# Patient Record
Sex: Female | Born: 2000 | Race: Black or African American | Hispanic: No | Marital: Single | Smoking: Never smoker
Health system: Southern US, Community
[De-identification: ages and names within clinical notes are randomized; demographics above are authoritative.]

## PROBLEM LIST (undated history)

## (undated) DIAGNOSIS — J309 Allergic rhinitis, unspecified: Secondary | ICD-10-CM

## (undated) DIAGNOSIS — E611 Iron deficiency: Secondary | ICD-10-CM

## (undated) DIAGNOSIS — G4700 Insomnia, unspecified: Secondary | ICD-10-CM

## (undated) DIAGNOSIS — J4520 Mild intermittent asthma, uncomplicated: Secondary | ICD-10-CM

## (undated) DIAGNOSIS — G43909 Migraine, unspecified, not intractable, without status migrainosus: Secondary | ICD-10-CM

## (undated) DIAGNOSIS — N946 Dysmenorrhea, unspecified: Secondary | ICD-10-CM

## (undated) HISTORY — DX: Migraine, unspecified, not intractable, without status migrainosus: G43.909

## (undated) HISTORY — DX: Iron deficiency: E61.1

## (undated) HISTORY — DX: Insomnia, unspecified: G47.00

## (undated) HISTORY — DX: Dysmenorrhea, unspecified: N94.6

## (undated) HISTORY — DX: Mild intermittent asthma, uncomplicated: J45.20

## (undated) HISTORY — DX: Allergic rhinitis, unspecified: J30.9

## (undated) HISTORY — PX: NO PAST SURGERIES: SHX2092

---
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
## 2019-04-26 ENCOUNTER — Other Ambulatory Visit: Payer: Self-pay

## 2019-04-26 DIAGNOSIS — Z20822 Contact with and (suspected) exposure to covid-19: Secondary | ICD-10-CM

## 2019-04-28 LAB — NOVEL CORONAVIRUS, NAA: SARS-CoV-2, NAA: NOT DETECTED

## 2019-08-16 ENCOUNTER — Encounter: Payer: Self-pay | Admitting: Pediatrics

## 2019-08-16 ENCOUNTER — Other Ambulatory Visit: Payer: Self-pay

## 2019-08-16 ENCOUNTER — Ambulatory Visit (INDEPENDENT_AMBULATORY_CARE_PROVIDER_SITE_OTHER): Payer: Medicaid Other | Admitting: Pediatrics

## 2019-08-16 VITALS — BP 120/79 | HR 68 | Ht 63.19 in | Wt 160.0 lb

## 2019-08-16 DIAGNOSIS — Z713 Dietary counseling and surveillance: Secondary | ICD-10-CM

## 2019-08-16 DIAGNOSIS — Z1389 Encounter for screening for other disorder: Secondary | ICD-10-CM

## 2019-08-16 DIAGNOSIS — Z113 Encounter for screening for infections with a predominantly sexual mode of transmission: Secondary | ICD-10-CM | POA: Diagnosis not present

## 2019-08-16 DIAGNOSIS — Z00121 Encounter for routine child health examination with abnormal findings: Secondary | ICD-10-CM

## 2019-08-16 NOTE — Patient Instructions (Addendum)
Well Child Nutrition, Teen This sheet provides general nutrition recommendations. Talk with a health care provider or a diet and nutrition specialist (dietitian) if you have any questions. Nutrition The amount of food you need to eat every day depends on your age, sex, size, and activity level. To figure out your daily calorie needs, look for a calorie calculator online or talk with your health care provider. Balanced diet Eat a balanced diet. Try to include:  Fruits. Aim for 1-2 cups a day. Examples of 1 cup of fruit include 1 large banana, 1 small apple, 8 large strawberries, or 1 large orange. Try to eat fresh or frozen fruits, and avoid fruits that have added sugars.  Vegetables. Aim for 2-3 cups a day. Examples of 1 cup of vegetables include 2 medium carrots, 1 large tomato, or 2 stalks of celery. Try to eat vegetables with a variety of colors.  Low-fat dairy. Aim for 3 cups a day. Examples of 1 cup of dairy include 8 oz (230 mL) of milk, 8 oz (230 g) of yogurt, or 1 oz (44 g) of natural cheese. Getting enough calcium and vitamin D is important for growth and healthy bones. Include fat-free or low-fat milk, cheese, and yogurt in your diet. If you are unable to tolerate dairy (lactose intolerant) or you choose not to consume dairy, you may include fortified soy beverages (soy milk).  Whole grains. Of the grain foods that you eat each day (such as pasta, rice, and tortillas), aim to include 6-8 "ounce-equivalents" of whole-grain options. Examples of 1 ounce-equivalent of whole grains include 1 cup of whole-wheat cereal,  cup of brown rice, or 1 slice of whole-wheat bread.  Lean proteins. Aim for 5-6 "ounce-equivalents" a day. Eat a variety of protein foods, including lean meats, seafood, poultry, eggs, legumes (beans and peas), nuts, seeds, and soy products. ? A cut of meat or fish that is the size of a deck of cards is about 3-4 ounce-equivalents. ? Foods that provide 1 ounce-equivalent of  protein include 1 egg,  cup of nuts or seeds, or 1 tablespoon (16 g) of peanut butter.  Tips for healthy snacking  A snack should not be the size of a full meal. Eat snacks that have 200 calories or less. Examples include: ?  whole-wheat pita with  cup hummus. ? 2 or 3 slices of deli [place] wrapped around one cheese stick. ?  apple with 1 tablespoon of peanut butter. ? 10 baked chips with salsa.  Keep cut-up fruits and vegetables available at home and at school so they are easy to eat.  Pack healthy snacks the night before or when you pack your lunch.  Avoid pre-packaged foods. These tend to be higher in fat, sugar, and salt (sodium).  Get involved with shopping, or ask the main food shopper in your family to get healthy snacks that you like.  Avoid chips, candy, cake, and soft drinks. Foods to avoid  [place] or heavily processed foods, such as hot dogs and microwaveable dinners.  Drinks that contain a lot of sugar, such as sports drinks, sodas, and juice.  Foods that contain a lot of fat, salt (sodium), or sugar. General instructions  Make time for regular exercise. Try to be active for 60 minutes every day.  Drink plenty of water, especially while you are playing sports or exercising.  Do not skip meals, especially breakfast.  Avoid overeating. Eat when you are hungry, and stop eating when you are full.  Do not  hesitate to try new foods.  Help with meal prep and learn how to prepare meals.  Avoid fad diets. These may affect your mood and growth.  If you are worried about your body image, talk with your parents, your health care provider, or another trusted adult like a coach or counselor. You may be at risk for developing an eating disorder. Eating disorders can lead to serious medical problems.  Food allergies may cause you to have a reaction (such as a rash, diarrhea, or vomiting) after eating or drinking. Talk with your health care provider if you have concerns  about food allergies. Summary  Eat a balanced diet. Include whole grains, fruits, vegetables, proteins, and low-fat dairy.  Choose healthy snacks that are 200 calories or less.  Drink plenty of water.  Be active for 60 minutes or more every day. This information is not intended to replace advice given to you by your health care provider. Make sure you discuss any questions you have with your health care provider. Document Released: 04/09/2017 Document Revised: 12/15/2018 Document Reviewed: 04/09/2017 Elsevier Patient Education  2020 [employer].  Teenage Dating Abuse In a dating relationship, teenagers may experience some form of abuse. Abuse can be physical, emotional, psychological, or sexual. It can also include harassment via texting, e-mail, or social media. Dating abuse happens in both gay and straight relationships. Either a female or a female can be the victim of dating abuse. Dating abuse is never the victim's fault. No one deserves to be a victim of dating abuse. Nothing you wear, do, or say gives another person the right to hurt you. Remember, you have every right to say "no." If you are in a dating relationship that feels uncomfortable or frightening in any way, trust your feelings and get out of it. It could become, or may already be, abusive. Many teenagers do not report abuse because they are afraid to tell family and friends. They may also be afraid that the abuser will retaliate if he or she finds out that the abuse was reported. However, it is important to get help right away if:  You are being abused.  A teenager you know shows signs of abuse or tells you that he or she is being abused.  You see anything that does not seem right. What are the different kinds of dating abuse? Verbal abuse  Threats.  Bullying.  Insults or false accusations.  Demeaning or belittling comments. Emotional and psychological abuse  Attempting to control a partner's activities.   Trying to destroy the person's self-confidence and self-esteem.  Isolating the person from other friends and family.  Public embarrassment.  Threats of violence.  Checking cell phones, e-mails, or social media without permission. Physical abuse  Punching, pushing, shoving, hitting, or slapping.  Kicking.  Throwing things at someone.  Biting.  Arm twisting or bending back fingers.  Hair pulling.  Slamming someone against a wall.  Using a weapon to harm the person. Sexual abuse  Sexual abuse is forced or unwanted sexual activity or rape.  It includes coercing or pressuring someone to engage in sexual activity, or trying to engage in sexual activity with someone who is under the influence of drugs or alcohol.  Date rape is when sexual intercourse occurs within a relationship but without consent.  Teenage girls in heterosexual relationships are the most likely to suffer from sexual abuse. Stalking Stalking usually occurs when one partner in a dating relationship wants to end the relationship and the other  partner does not. If the partner who wants to continue makes the other partner feel frightened, threatened, or harassed, it may be stalking. Examples of stalking behavior include:  Following.  Spying.  Confronting.  Appearing at the partner's home, school, or place of work.  Sending unwanted gifts.  Repeatedly contacting the person by phone, mail, or e-mail.  Using Internet sites to stalk. What are the risk factors for dating abuse? Any teenager can be a victim of dating abuse. However, certain risk factors can make it more likely that a teenager may be abused. These include:  Being female.  Living in a low-income household.  Being sexually active.  Being involved in delinquent activities, including violent behavior and drug use.  Being from a risky family background or social environment. What are the warning signs of dating abuse? It is a warning sign of  dating abuse if your partner:  Pressures you to have sex.  Becomes extremely jealous or possessive and thinks that these displays of emotion are signs of love.  Tries to control you and forcefully make all decisions.  Tries to keep you from spending time with close friends or family.  Verbally and emotionally abuses you by: ? Yelling at you. ? Spreading rumors about you. ? Manipulating you. ? Swearing at you. ? Trying to make you feel guilty. ? Checking your cell phone or social media without your permission.  Drinks too much or uses drugs and then blames the abusive behavior on the alcohol and drugs.  Threatens physical violence.  Has abused a previous partner.  Accepts or defends the use of violence by others. What are common behaviors of people affected by abuse? You may be a victim of abuse if you:  Make excuses for your partner or apologize for poor behavior.  Stop seeing friends or family, or do not show up to places as promised.  Lose interest in activities that you used to enjoy.  Have to let your partner know where you are and who you are with.  Are constantly monitored by a jealous partner with calls and texts. What can I do if I think I am being abused? If you suspect abuse, or if you or someone you know has experienced abuse:  Text "loveis" to (564) 615-3195 to talk with a peer advocate.  Go online to www.thehotline.org, or call the [place] [facility] [employer] 531 093 5374).  Call your local emergency services (911 in the [place] How can teenage dating abuse be prevented? The worst thing to do is keep your fears to yourself. Talk to someone you trust who can help.  Ask for help from a parent, guardian, teacher, or health care provider if you are worried about dating violence. Take some basic precautions to protect yourself: ? Tell your friends and family that you are worried about your relationship. ? When you go out, make sure that  someone knows where you are going and what time you plan to return. ? Go out with a group of friends. ? Plan a safe way to get home. What are the treatment or care options? Treatment programs for dating abuse and violence include:  Protective services, such as: ? Social services. ? Safety planning. ? Restraining orders. ? Legally mandated treatment for the abuser.  Mental health services, such as: ? Individual, family, or group counseling. ? Support groups.  Medical services to treat any injuries or STIs (sexually transmitted infections). Where can I get more information?  National Dating Abuse Helpline: ?  Phone: 215-738-01861-272-714-2318. ? Online: www.loveisrespect.org ? Text: "loveis" to 9811922522.  National Domestic Violence Hotline: ? Phone: 1-800-799-SAFE ((340) 857-37201-929 657 9153). ? Online: www.thehotline.org  National Sexual Assault Hotline: 1-800-656-HOPE 518-236-7861(1-613-293-6702).  National Sexual Assault Online Hotline: ohl.rainn.org  Centers for Disease Control and Prevention (CDC): FootballExhibition.com.brwww.cdc.gov Summary  Every young person has a right to safe and healthy relationships. Learn the signs of abuse and, if you believe you are the victim of dating abuse, seek help. This information is not intended to replace advice given to you by your health care provider. Make sure you discuss any questions you have with your health care provider. Document Released: 06/23/2007 Document Revised: 10/08/2017 Document Reviewed: 10/08/2017 Elsevier Patient Education  2020 [employer]
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
## 2019-08-17 LAB — CHLAMYDIA/GC NAA, CONFIRMATION
Chlamydia trachomatis, NAA: NEGATIVE
Neisseria gonorrhoeae, NAA: NEGATIVE

## 2019-08-30 ENCOUNTER — Ambulatory Visit: Payer: Medicaid Other | Attending: Pediatrics

## 2019-12-06 ENCOUNTER — Telehealth: Payer: Self-pay | Admitting: Adult Health

## 2019-12-06 NOTE — Telephone Encounter (Signed)
Tried to reach the patient to remind her of her appointment/restrictions, no answer, phone just rang and rang.

## 2019-12-07 ENCOUNTER — Encounter: Payer: Medicaid Other | Admitting: Adult Health

## 2019-12-30 DIAGNOSIS — Z3491 Encounter for supervision of normal pregnancy, unspecified, first trimester: Secondary | ICD-10-CM | POA: Diagnosis not present

## 2019-12-30 DIAGNOSIS — N912 Amenorrhea, unspecified: Secondary | ICD-10-CM | POA: Diagnosis not present

## 2019-12-30 DIAGNOSIS — Z3201 Encounter for pregnancy test, result positive: Secondary | ICD-10-CM | POA: Diagnosis not present

## 2019-12-30 DIAGNOSIS — Z3A08 8 weeks gestation of pregnancy: Secondary | ICD-10-CM | POA: Diagnosis not present

## 2019-12-30 DIAGNOSIS — Z3682 Encounter for antenatal screening for nuchal translucency: Secondary | ICD-10-CM | POA: Diagnosis not present

## 2020-01-24 DIAGNOSIS — Z3A12 12 weeks gestation of pregnancy: Secondary | ICD-10-CM | POA: Diagnosis not present

## 2020-01-24 DIAGNOSIS — Z3689 Encounter for other specified antenatal screening: Secondary | ICD-10-CM | POA: Diagnosis not present

## 2020-01-24 DIAGNOSIS — Z3401 Encounter for supervision of normal first pregnancy, first trimester: Secondary | ICD-10-CM | POA: Diagnosis not present

## 2020-01-24 DIAGNOSIS — Z3682 Encounter for antenatal screening for nuchal translucency: Secondary | ICD-10-CM | POA: Diagnosis not present

## 2020-02-24 DIAGNOSIS — Z3689 Encounter for other specified antenatal screening: Secondary | ICD-10-CM | POA: Diagnosis not present

## 2020-03-01 DIAGNOSIS — Z3A18 18 weeks gestation of pregnancy: Secondary | ICD-10-CM | POA: Diagnosis not present

## 2020-03-01 DIAGNOSIS — O219 Vomiting of pregnancy, unspecified: Secondary | ICD-10-CM | POA: Diagnosis not present

## 2020-03-01 DIAGNOSIS — O2312 Infections of bladder in pregnancy, second trimester: Secondary | ICD-10-CM | POA: Diagnosis not present

## 2020-03-01 DIAGNOSIS — R112 Nausea with vomiting, unspecified: Secondary | ICD-10-CM | POA: Diagnosis not present

## 2020-03-16 DIAGNOSIS — Z3A20 20 weeks gestation of pregnancy: Secondary | ICD-10-CM | POA: Diagnosis not present

## 2020-03-16 DIAGNOSIS — O321XX0 Maternal care for breech presentation, not applicable or unspecified: Secondary | ICD-10-CM | POA: Diagnosis not present

## 2020-03-16 DIAGNOSIS — Z363 Encounter for antenatal screening for malformations: Secondary | ICD-10-CM | POA: Diagnosis not present

## 2020-05-11 DIAGNOSIS — Z3689 Encounter for other specified antenatal screening: Secondary | ICD-10-CM | POA: Diagnosis not present

## 2020-05-11 DIAGNOSIS — Z23 Encounter for immunization: Secondary | ICD-10-CM | POA: Diagnosis not present

## 2020-05-11 DIAGNOSIS — R8289 Other abnormal findings on cytological and histological examination of urine: Secondary | ICD-10-CM | POA: Diagnosis not present

## 2020-05-29 DIAGNOSIS — R8290 Unspecified abnormal findings in urine: Secondary | ICD-10-CM | POA: Diagnosis not present

## 2020-05-29 DIAGNOSIS — Z23 Encounter for immunization: Secondary | ICD-10-CM | POA: Diagnosis not present

## 2020-06-12 DIAGNOSIS — R8289 Other abnormal findings on cytological and histological examination of urine: Secondary | ICD-10-CM | POA: Diagnosis not present

## 2020-06-15 DIAGNOSIS — Z3A33 33 weeks gestation of pregnancy: Secondary | ICD-10-CM | POA: Diagnosis not present

## 2020-06-15 DIAGNOSIS — O99891 Other specified diseases and conditions complicating pregnancy: Secondary | ICD-10-CM | POA: Diagnosis not present

## 2020-06-15 DIAGNOSIS — R1030 Lower abdominal pain, unspecified: Secondary | ICD-10-CM | POA: Diagnosis not present

## 2020-07-10 DIAGNOSIS — Z3A00 Weeks of gestation of pregnancy not specified: Secondary | ICD-10-CM | POA: Diagnosis not present

## 2020-07-10 DIAGNOSIS — Z3A37 37 weeks gestation of pregnancy: Secondary | ICD-10-CM | POA: Diagnosis not present

## 2020-07-10 DIAGNOSIS — R519 Headache, unspecified: Secondary | ICD-10-CM | POA: Diagnosis not present

## 2020-07-10 DIAGNOSIS — R030 Elevated blood-pressure reading, without diagnosis of hypertension: Secondary | ICD-10-CM | POA: Diagnosis not present

## 2020-07-10 DIAGNOSIS — Z3A36 36 weeks gestation of pregnancy: Secondary | ICD-10-CM | POA: Diagnosis not present

## 2020-07-10 DIAGNOSIS — Z3689 Encounter for other specified antenatal screening: Secondary | ICD-10-CM | POA: Diagnosis not present

## 2020-07-10 DIAGNOSIS — O99891 Other specified diseases and conditions complicating pregnancy: Secondary | ICD-10-CM | POA: Diagnosis not present

## 2020-07-10 DIAGNOSIS — R600 Localized edema: Secondary | ICD-10-CM | POA: Diagnosis not present

## 2020-07-10 DIAGNOSIS — O163 Unspecified maternal hypertension, third trimester: Secondary | ICD-10-CM | POA: Insufficient documentation

## 2020-07-11 DIAGNOSIS — Z3A00 Weeks of gestation of pregnancy not specified: Secondary | ICD-10-CM | POA: Diagnosis not present

## 2020-07-11 DIAGNOSIS — O163 Unspecified maternal hypertension, third trimester: Secondary | ICD-10-CM | POA: Diagnosis not present

## 2020-07-12 DIAGNOSIS — Z20822 Contact with and (suspected) exposure to covid-19: Secondary | ICD-10-CM | POA: Diagnosis not present

## 2020-07-12 DIAGNOSIS — O9902 Anemia complicating childbirth: Secondary | ICD-10-CM | POA: Diagnosis not present

## 2020-07-12 DIAGNOSIS — Z3A37 37 weeks gestation of pregnancy: Secondary | ICD-10-CM | POA: Diagnosis not present

## 2020-07-12 DIAGNOSIS — O165 Unspecified maternal hypertension, complicating the puerperium: Secondary | ICD-10-CM | POA: Diagnosis not present

## 2020-07-12 DIAGNOSIS — O134 Gestational [pregnancy-induced] hypertension without significant proteinuria, complicating childbirth: Secondary | ICD-10-CM | POA: Diagnosis not present

## 2020-07-12 DIAGNOSIS — O133 Gestational [pregnancy-induced] hypertension without significant proteinuria, third trimester: Secondary | ICD-10-CM | POA: Diagnosis not present

## 2020-07-12 DIAGNOSIS — O8612 Endometritis following delivery: Secondary | ICD-10-CM | POA: Diagnosis not present

## 2020-07-13 DIAGNOSIS — Z3A37 37 weeks gestation of pregnancy: Secondary | ICD-10-CM | POA: Diagnosis not present

## 2020-07-13 DIAGNOSIS — O133 Gestational [pregnancy-induced] hypertension without significant proteinuria, third trimester: Secondary | ICD-10-CM | POA: Diagnosis not present

## 2020-07-13 DIAGNOSIS — O165 Unspecified maternal hypertension, complicating the puerperium: Secondary | ICD-10-CM | POA: Diagnosis not present

## 2021-11-30 ENCOUNTER — Other Ambulatory Visit: Payer: Self-pay

## 2021-11-30 ENCOUNTER — Emergency Department (HOSPITAL_COMMUNITY): Payer: Medicaid Other

## 2021-11-30 ENCOUNTER — Encounter (HOSPITAL_COMMUNITY): Payer: Self-pay

## 2021-11-30 ENCOUNTER — Emergency Department (HOSPITAL_COMMUNITY)
Admission: EM | Admit: 2021-11-30 | Discharge: 2021-11-30 | Disposition: A | Discharge status: Home / Self Care | Payer: Medicaid Other | Attending: Emergency Medicine | Admitting: Emergency Medicine

## 2021-11-30 DIAGNOSIS — S5001XA Contusion of right elbow, initial encounter: Secondary | ICD-10-CM | POA: Diagnosis not present

## 2021-11-30 DIAGNOSIS — W232XXA Caught, crushed, jammed or pinched between a moving and stationary object, initial encounter: Secondary | ICD-10-CM | POA: Diagnosis not present

## 2021-11-30 DIAGNOSIS — M25521 Pain in right elbow: Secondary | ICD-10-CM | POA: Diagnosis not present
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
## 2021-11-30 NOTE — Discharge Instructions (Signed)
Your x-ray was negative.  Follow-up with your primary if her symptoms continue.  Take Tylenol Motrin for pain, no fracture was noted. ?

## 2022-01-27 DIAGNOSIS — R102 Pelvic and perineal pain: Secondary | ICD-10-CM | POA: Diagnosis not present

## 2022-01-27 DIAGNOSIS — N920 Excessive and frequent menstruation with regular cycle: Secondary | ICD-10-CM | POA: Diagnosis not present

## 2022-01-27 DIAGNOSIS — R109 Unspecified abdominal pain: Secondary | ICD-10-CM | POA: Diagnosis not present

## 2022-02-20 DIAGNOSIS — Z6832 Body mass index (BMI) 32.0-32.9, adult: Secondary | ICD-10-CM | POA: Diagnosis not present

## 2022-02-20 DIAGNOSIS — L03111 Cellulitis of right axilla: Secondary | ICD-10-CM | POA: Diagnosis not present

## 2022-02-20 DIAGNOSIS — L732 Hidradenitis suppurativa: Secondary | ICD-10-CM | POA: Diagnosis not present

## 2022-02-20 DIAGNOSIS — L03112 Cellulitis of left axilla: Secondary | ICD-10-CM | POA: Diagnosis not present

## 2022-02-26 DIAGNOSIS — L02412 Cutaneous abscess of left axilla: Secondary | ICD-10-CM | POA: Diagnosis not present

## 2022-02-26 DIAGNOSIS — Z6831 Body mass index (BMI) 31.0-31.9, adult: Secondary | ICD-10-CM | POA: Diagnosis not present

## 2022-03-01 DIAGNOSIS — Z6831 Body mass index (BMI) 31.0-31.9, adult: Secondary | ICD-10-CM | POA: Diagnosis not present

## 2022-03-01 DIAGNOSIS — Z5189 Encounter for other specified aftercare: Secondary | ICD-10-CM | POA: Diagnosis not present

## 2022-03-05 DIAGNOSIS — Z6831 Body mass index (BMI) 31.0-31.9, adult: Secondary | ICD-10-CM | POA: Diagnosis not present

## 2022-03-05 DIAGNOSIS — L732 Hidradenitis suppurativa: Secondary | ICD-10-CM | POA: Diagnosis not present

## 2022-03-29 DIAGNOSIS — O26899 Other specified pregnancy related conditions, unspecified trimester: Secondary | ICD-10-CM | POA: Diagnosis not present

## 2022-03-29 DIAGNOSIS — Z6829 Body mass index (BMI) 29.0-29.9, adult: Secondary | ICD-10-CM | POA: Diagnosis not present

## 2022-03-29 DIAGNOSIS — R109 Unspecified abdominal pain: Secondary | ICD-10-CM | POA: Diagnosis not present

## 2022-03-29 DIAGNOSIS — R059 Cough, unspecified: Secondary | ICD-10-CM | POA: Diagnosis not present

## 2022-03-30 DIAGNOSIS — O99511 Diseases of the respiratory system complicating pregnancy, first trimester: Secondary | ICD-10-CM | POA: Diagnosis not present

## 2022-03-30 DIAGNOSIS — J40 Bronchitis, not specified as acute or chronic: Secondary | ICD-10-CM | POA: Diagnosis not present

## 2022-03-30 DIAGNOSIS — R059 Cough, unspecified: Secondary | ICD-10-CM | POA: Diagnosis not present

## 2022-03-30 DIAGNOSIS — Z20822 Contact with and (suspected) exposure to covid-19: Secondary | ICD-10-CM | POA: Diagnosis not present

## 2022-04-11 DIAGNOSIS — N912 Amenorrhea, unspecified: Secondary | ICD-10-CM | POA: Diagnosis not present

## 2022-04-11 DIAGNOSIS — Z3201 Encounter for pregnancy test, result positive: Secondary | ICD-10-CM | POA: Diagnosis not present

## 2022-04-11 DIAGNOSIS — Z3490 Encounter for supervision of normal pregnancy, unspecified, unspecified trimester: Secondary | ICD-10-CM | POA: Diagnosis not present

## 2022-04-11 DIAGNOSIS — Z3689 Encounter for other specified antenatal screening: Secondary | ICD-10-CM | POA: Diagnosis not present

## 2022-04-11 DIAGNOSIS — Z3481 Encounter for supervision of other normal pregnancy, first trimester: Secondary | ICD-10-CM | POA: Diagnosis not present

## 2022-04-11 DIAGNOSIS — Z36 Encounter for antenatal screening for chromosomal anomalies: Secondary | ICD-10-CM | POA: Diagnosis not present

## 2022-05-23 DIAGNOSIS — L732 Hidradenitis suppurativa: Secondary | ICD-10-CM | POA: Diagnosis not present

## 2022-05-23 DIAGNOSIS — Z79899 Other long term (current) drug therapy: Secondary | ICD-10-CM | POA: Diagnosis not present

## 2022-06-06 DIAGNOSIS — R7989 Other specified abnormal findings of blood chemistry: Secondary | ICD-10-CM | POA: Diagnosis not present

## 2022-06-06 DIAGNOSIS — Z3492 Encounter for supervision of normal pregnancy, unspecified, second trimester: Secondary | ICD-10-CM | POA: Diagnosis not present

## 2022-06-06 DIAGNOSIS — Z3689 Encounter for other specified antenatal screening: Secondary | ICD-10-CM | POA: Diagnosis not present

## 2022-07-04 DIAGNOSIS — Z23 Encounter for immunization: Secondary | ICD-10-CM | POA: Diagnosis not present

## 2022-07-04 DIAGNOSIS — Z362 Encounter for other antenatal screening follow-up: Secondary | ICD-10-CM | POA: Diagnosis not present

## 2022-08-05 DIAGNOSIS — O365990 Maternal care for other known or suspected poor fetal growth, unspecified trimester, not applicable or unspecified: Secondary | ICD-10-CM | POA: Diagnosis not present

## 2022-08-15 DIAGNOSIS — Z3689 Encounter for other specified antenatal screening: Secondary | ICD-10-CM | POA: Diagnosis not present

## 2022-08-15 DIAGNOSIS — Z23 Encounter for immunization: Secondary | ICD-10-CM | POA: Diagnosis not present

## 2022-08-15 DIAGNOSIS — O365990 Maternal care for other known or suspected poor fetal growth, unspecified trimester, not applicable or unspecified: Secondary | ICD-10-CM | POA: Diagnosis not present

## 2022-08-16 DIAGNOSIS — O99013 Anemia complicating pregnancy, third trimester: Secondary | ICD-10-CM | POA: Insufficient documentation

## 2022-08-20 DIAGNOSIS — L732 Hidradenitis suppurativa: Secondary | ICD-10-CM | POA: Diagnosis not present

## 2022-08-20 DIAGNOSIS — L02411 Cutaneous abscess of right axilla: Secondary | ICD-10-CM | POA: Diagnosis not present

## 2022-08-26 DIAGNOSIS — Z363 Encounter for antenatal screening for malformations: Secondary | ICD-10-CM | POA: Diagnosis not present

## 2022-08-26 DIAGNOSIS — Z8279 Family history of other congenital malformations, deformations and chromosomal abnormalities: Secondary | ICD-10-CM | POA: Diagnosis not present

## 2022-08-26 DIAGNOSIS — O365990 Maternal care for other known or suspected poor fetal growth, unspecified trimester, not applicable or unspecified: Secondary | ICD-10-CM | POA: Diagnosis not present

## 2022-08-26 DIAGNOSIS — O09893 Supervision of other high risk pregnancies, third trimester: Secondary | ICD-10-CM | POA: Diagnosis not present

## 2022-08-26 DIAGNOSIS — O99213 Obesity complicating pregnancy, third trimester: Secondary | ICD-10-CM | POA: Diagnosis not present

## 2022-08-26 DIAGNOSIS — Z3A30 30 weeks gestation of pregnancy: Secondary | ICD-10-CM | POA: Diagnosis not present

## 2022-08-26 DIAGNOSIS — Z3689 Encounter for other specified antenatal screening: Secondary | ICD-10-CM | POA: Diagnosis not present

## 2022-08-26 DIAGNOSIS — O365930 Maternal care for other known or suspected poor fetal growth, third trimester, not applicable or unspecified: Secondary | ICD-10-CM | POA: Diagnosis not present

## 2022-08-30 DIAGNOSIS — O365990 Maternal care for other known or suspected poor fetal growth, unspecified trimester, not applicable or unspecified: Secondary | ICD-10-CM | POA: Diagnosis not present

## 2022-09-05 DIAGNOSIS — J00 Acute nasopharyngitis [common cold]: Secondary | ICD-10-CM | POA: Diagnosis not present

## 2022-09-05 DIAGNOSIS — Z20822 Contact with and (suspected) exposure to covid-19: Secondary | ICD-10-CM | POA: Diagnosis not present

## 2022-09-12 DIAGNOSIS — Z3A32 32 weeks gestation of pregnancy: Secondary | ICD-10-CM | POA: Diagnosis not present

## 2022-09-12 DIAGNOSIS — O365930 Maternal care for other known or suspected poor fetal growth, third trimester, not applicable or unspecified: Secondary | ICD-10-CM | POA: Diagnosis not present

## 2022-09-16 DIAGNOSIS — O09893 Supervision of other high risk pregnancies, third trimester: Secondary | ICD-10-CM | POA: Diagnosis not present

## 2022-09-16 DIAGNOSIS — Z3689 Encounter for other specified antenatal screening: Secondary | ICD-10-CM | POA: Diagnosis not present

## 2022-09-16 DIAGNOSIS — O365930 Maternal care for other known or suspected poor fetal growth, third trimester, not applicable or unspecified: Secondary | ICD-10-CM | POA: Diagnosis not present

## 2022-09-16 DIAGNOSIS — O99213 Obesity complicating pregnancy, third trimester: Secondary | ICD-10-CM | POA: Diagnosis not present

## 2022-09-16 DIAGNOSIS — Z3A33 33 weeks gestation of pregnancy: Secondary | ICD-10-CM | POA: Diagnosis not present

## 2022-09-16 DIAGNOSIS — Z369 Encounter for antenatal screening, unspecified: Secondary | ICD-10-CM | POA: Diagnosis not present

## 2022-09-16 DIAGNOSIS — Z362 Encounter for other antenatal screening follow-up: Secondary | ICD-10-CM | POA: Diagnosis not present

## 2022-09-30 DIAGNOSIS — O365990 Maternal care for other known or suspected poor fetal growth, unspecified trimester, not applicable or unspecified: Secondary | ICD-10-CM | POA: Diagnosis not present

## 2022-09-30 DIAGNOSIS — O365930 Maternal care for other known or suspected poor fetal growth, third trimester, not applicable or unspecified: Secondary | ICD-10-CM | POA: Diagnosis not present

## 2022-10-07 DIAGNOSIS — O09893 Supervision of other high risk pregnancies, third trimester: Secondary | ICD-10-CM | POA: Diagnosis not present

## 2022-10-07 DIAGNOSIS — Z362 Encounter for other antenatal screening follow-up: Secondary | ICD-10-CM | POA: Diagnosis not present

## 2022-10-07 DIAGNOSIS — O365930 Maternal care for other known or suspected poor fetal growth, third trimester, not applicable or unspecified: Secondary | ICD-10-CM | POA: Diagnosis not present

## 2022-10-07 DIAGNOSIS — Z3A36 36 weeks gestation of pregnancy: Secondary | ICD-10-CM | POA: Diagnosis not present

## 2022-10-07 DIAGNOSIS — O99213 Obesity complicating pregnancy, third trimester: Secondary | ICD-10-CM | POA: Diagnosis not present

## 2022-10-07 DIAGNOSIS — Z3689 Encounter for other specified antenatal screening: Secondary | ICD-10-CM | POA: Diagnosis not present

## 2022-10-10 DIAGNOSIS — Z3689 Encounter for other specified antenatal screening: Secondary | ICD-10-CM | POA: Diagnosis not present

## 2022-10-10 DIAGNOSIS — O365990 Maternal care for other known or suspected poor fetal growth, unspecified trimester, not applicable or unspecified: Secondary | ICD-10-CM | POA: Diagnosis not present

## 2022-10-13 DIAGNOSIS — D62 Acute posthemorrhagic anemia: Secondary | ICD-10-CM | POA: Diagnosis not present

## 2022-10-13 DIAGNOSIS — O365930 Maternal care for other known or suspected poor fetal growth, third trimester, not applicable or unspecified: Secondary | ICD-10-CM | POA: Diagnosis not present

## 2022-10-13 DIAGNOSIS — Z3A37 37 weeks gestation of pregnancy: Secondary | ICD-10-CM | POA: Diagnosis not present

## 2022-10-13 DIAGNOSIS — L732 Hidradenitis suppurativa: Secondary | ICD-10-CM | POA: Diagnosis not present

## 2022-10-13 DIAGNOSIS — O9902 Anemia complicating childbirth: Secondary | ICD-10-CM | POA: Diagnosis not present

## 2022-10-13 DIAGNOSIS — O9972 Diseases of the skin and subcutaneous tissue complicating childbirth: Secondary | ICD-10-CM | POA: Diagnosis not present

## 2022-10-14 DIAGNOSIS — Z3A37 37 weeks gestation of pregnancy: Secondary | ICD-10-CM | POA: Diagnosis not present

## 2022-10-14 DIAGNOSIS — L732 Hidradenitis suppurativa: Secondary | ICD-10-CM | POA: Diagnosis not present

## 2022-10-14 DIAGNOSIS — O3473 Maternal care for abnormality of vulva and perineum, third trimester: Secondary | ICD-10-CM | POA: Diagnosis not present

## 2022-10-14 DIAGNOSIS — O365990 Maternal care for other known or suspected poor fetal growth, unspecified trimester, not applicable or unspecified: Secondary | ICD-10-CM | POA: Diagnosis not present

## 2022-10-14 DIAGNOSIS — Z3A00 Weeks of gestation of pregnancy not specified: Secondary | ICD-10-CM | POA: Diagnosis not present

## 2022-10-14 DIAGNOSIS — O9952 Diseases of the respiratory system complicating childbirth: Secondary | ICD-10-CM | POA: Diagnosis not present

## 2023-01-07 DIAGNOSIS — L732 Hidradenitis suppurativa: Secondary | ICD-10-CM | POA: Diagnosis not present
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
## 2023-01-26 DIAGNOSIS — R079 Chest pain, unspecified: Secondary | ICD-10-CM | POA: Diagnosis not present

## 2023-01-26 DIAGNOSIS — I10 Essential (primary) hypertension: Secondary | ICD-10-CM | POA: Diagnosis not present

## 2023-01-26 DIAGNOSIS — R0789 Other chest pain: Secondary | ICD-10-CM | POA: Diagnosis not present

## 2023-01-26 DIAGNOSIS — J45909 Unspecified asthma, uncomplicated: Secondary | ICD-10-CM | POA: Diagnosis not present

## 2023-01-26 DIAGNOSIS — N926 Irregular menstruation, unspecified: Secondary | ICD-10-CM | POA: Diagnosis not present

## 2023-01-26 DIAGNOSIS — L732 Hidradenitis suppurativa: Secondary | ICD-10-CM | POA: Diagnosis not present

## 2023-01-27 DIAGNOSIS — R079 Chest pain, unspecified: Secondary | ICD-10-CM | POA: Diagnosis not present

## 2023-02-12 ENCOUNTER — Ambulatory Visit: Payer: Medicaid Other | Admitting: Family Medicine

## 2023-02-20 ENCOUNTER — Ambulatory Visit: Payer: Medicaid Other | Admitting: Nurse Practitioner

## 2023-03-03 ENCOUNTER — Ambulatory Visit: Payer: Medicaid Other | Admitting: Nurse Practitioner

## 2023-03-03 ENCOUNTER — Encounter (HOSPITAL_COMMUNITY): Payer: Self-pay | Admitting: Emergency Medicine

## 2023-03-03 ENCOUNTER — Observation Stay (HOSPITAL_COMMUNITY)
Admission: EM | Admit: 2023-03-03 | Discharge: 2023-03-04 | Disposition: A | Discharge status: Home / Self Care | Payer: Medicaid Other | Attending: Family Medicine | Admitting: Family Medicine

## 2023-03-03 ENCOUNTER — Encounter: Payer: Self-pay | Admitting: Nurse Practitioner

## 2023-03-03 ENCOUNTER — Other Ambulatory Visit: Payer: Self-pay

## 2023-03-03 VITALS — BP 110/75 | HR 123 | Temp 98.6°F | Ht 63.0 in | Wt 143.2 lb

## 2023-03-03 DIAGNOSIS — E876 Hypokalemia: Secondary | ICD-10-CM | POA: Insufficient documentation

## 2023-03-03 DIAGNOSIS — Z79899 Other long term (current) drug therapy: Secondary | ICD-10-CM | POA: Diagnosis not present

## 2023-03-03 DIAGNOSIS — L0390 Cellulitis, unspecified: Principal | ICD-10-CM | POA: Insufficient documentation

## 2023-03-03 DIAGNOSIS — O99013 Anemia complicating pregnancy, third trimester: Secondary | ICD-10-CM | POA: Diagnosis not present

## 2023-03-03 DIAGNOSIS — J45909 Unspecified asthma, uncomplicated: Secondary | ICD-10-CM | POA: Diagnosis not present

## 2023-03-03 DIAGNOSIS — R079 Chest pain, unspecified: Secondary | ICD-10-CM | POA: Diagnosis not present

## 2023-03-03 DIAGNOSIS — F419 Anxiety disorder, unspecified: Secondary | ICD-10-CM

## 2023-03-03 DIAGNOSIS — Z0000 Encounter for general adult medical examination without abnormal findings: Secondary | ICD-10-CM | POA: Diagnosis not present

## 2023-03-03 DIAGNOSIS — D509 Iron deficiency anemia, unspecified: Secondary | ICD-10-CM | POA: Insufficient documentation

## 2023-03-03 DIAGNOSIS — L732 Hidradenitis suppurativa: Secondary | ICD-10-CM

## 2023-03-03 DIAGNOSIS — F32A Depression, unspecified: Secondary | ICD-10-CM | POA: Diagnosis not present

## 2023-03-03 DIAGNOSIS — Z7689 Persons encountering health services in other specified circumstances: Secondary | ICD-10-CM | POA: Diagnosis not present

## 2023-03-03 DIAGNOSIS — T148XXA Other injury of unspecified body region, initial encounter: Secondary | ICD-10-CM | POA: Diagnosis not present

## 2023-03-03 DIAGNOSIS — L02419 Cutaneous abscess of limb, unspecified: Secondary | ICD-10-CM

## 2023-03-03 LAB — COMPREHENSIVE METABOLIC PANEL
ALT: 12 U/L (ref 0–44)
AST: 18 U/L (ref 15–41)
Albumin: 3.3 g/dL — ABNORMAL LOW (ref 3.5–5.0)
Alkaline Phosphatase: 95 U/L (ref 38–126)
Anion gap: 9 (ref 5–15)
BUN: 8 mg/dL (ref 6–20)
CO2: 24 mmol/L (ref 22–32)
Calcium: 8.9 mg/dL (ref 8.9–10.3)
Chloride: 101 mmol/L (ref 98–111)
Creatinine, Ser: 0.61 mg/dL (ref 0.44–1.00)
GFR, Estimated: >60 mL/min (ref >60)
Glucose, Bld: 105 mg/dL — ABNORMAL HIGH (ref 70–99)
Potassium: 3.4 mmol/L — ABNORMAL LOW (ref 3.5–5.1)
Sodium: 134 mmol/L — ABNORMAL LOW (ref 135–145)
Total Bilirubin: 0.5 mg/dL (ref 0.3–1.2)
Total Protein: 8.5 g/dL — ABNORMAL HIGH (ref 6.5–8.1)

## 2023-03-03 LAB — URINALYSIS, ROUTINE W REFLEX MICROSCOPIC
Bilirubin Urine: NEGATIVE
Glucose, UA: NEGATIVE mg/dL
Ketones, ur: NEGATIVE mg/dL
Nitrite: NEGATIVE
Protein, ur: NEGATIVE mg/dL
Specific Gravity, Urine: 1.017 (ref 1.005–1.030)
pH: 5 (ref 5.0–8.0)

## 2023-03-03 LAB — CBC WITH DIFFERENTIAL/PLATELET
Abs Immature Granulocytes: 0.09 10*3/uL — ABNORMAL HIGH (ref 0.00–0.07)
Basophils Absolute: 0.1 10*3/uL (ref 0.0–0.1)
Basophils Relative: 0 %
Eosinophils Absolute: 0.1 10*3/uL (ref 0.0–0.5)
Eosinophils Relative: 1 %
HCT: 32.9 % — ABNORMAL LOW (ref 36.0–46.0)
Hemoglobin: 10 g/dL — ABNORMAL LOW (ref 12.0–15.0)
Immature Granulocytes: 1 %
Lymphocytes Relative: 13 %
Lymphs Abs: 1.6 10*3/uL (ref 0.7–4.0)
MCH: 23.1 pg — ABNORMAL LOW (ref 26.0–34.0)
MCHC: 30.4 g/dL (ref 30.0–36.0)
MCV: 76.2 fL — ABNORMAL LOW (ref 80.0–100.0)
Monocytes Absolute: 0.6 10*3/uL (ref 0.1–1.0)
Monocytes Relative: 5 %
Neutro Abs: 10 10*3/uL — ABNORMAL HIGH (ref 1.7–7.7)
Neutrophils Relative %: 80 %
Platelets: 567 10*3/uL — ABNORMAL HIGH (ref 150–400)
RBC: 4.32 MIL/uL (ref 3.87–5.11)
RDW: 14.6 % (ref 11.5–15.5)
WBC: 12.5 10*3/uL — ABNORMAL HIGH (ref 4.0–10.5)
nRBC: 0 % (ref 0.0–0.2)

## 2023-03-03 LAB — PREGNANCY, URINE: Preg Test, Ur: NEGATIVE

## 2023-03-03 LAB — CULTURE, BLOOD (ROUTINE X 2): Special Requests: ADEQUATE

## 2023-03-03 LAB — BAYER DCA HB A1C WAIVED: HB A1C (BAYER DCA - WAIVED): 5.3 % (ref 4.8–5.6)

## 2023-03-03 MED ORDER — ONDANSETRON HCL 4 MG PO TABS: 4.0000 mg | ORAL_TABLET | Freq: Four times a day (QID) | ORAL | Status: DC | PRN

## 2023-03-03 MED ORDER — ONDANSETRON HCL 4 MG/2ML IJ SOLN: 4.0000 mg | Freq: Four times a day (QID) | INTRAMUSCULAR | Status: DC | PRN

## 2023-03-03 MED ORDER — SODIUM CHLORIDE 0.9 % IV BOLUS
500.0000 mL | Freq: Once | INTRAVENOUS | Status: AC
  Administered 2023-03-03: 500 mL via INTRAVENOUS

## 2023-03-03 MED ORDER — SODIUM CHLORIDE 0.9 % IV SOLN
2.0000 g | Freq: Once | INTRAVENOUS | Status: AC
  Administered 2023-03-03: 2 g via INTRAVENOUS
  Filled 2023-03-03: qty 12.5

## 2023-03-03 MED ORDER — OXYCODONE HCL 5 MG PO TABS: 5.0000 mg | ORAL_TABLET | ORAL | Status: DC | PRN

## 2023-03-03 MED ORDER — ESCITALOPRAM OXALATE 5 MG PO TABS
5.0000 mg | ORAL_TABLET | Freq: Every day | ORAL | 0 refills | Status: AC
Start: 2023-03-03 — End: ?

## 2023-03-03 MED ORDER — VANCOMYCIN HCL IN DEXTROSE 1-5 GM/200ML-% IV SOLN
1000.0000 mg | Freq: Two times a day (BID) | INTRAVENOUS | Status: DC
  Administered 2023-03-04: 1000 mg via INTRAVENOUS
  Filled 2023-03-03: qty 200

## 2023-03-03 MED ORDER — ACETAMINOPHEN 650 MG RE SUPP: 650.0000 mg | Freq: Four times a day (QID) | RECTAL | Status: DC | PRN

## 2023-03-03 MED ORDER — DIPHENHYDRAMINE HCL 50 MG/ML IJ SOLN
25.0000 mg | Freq: Once | INTRAMUSCULAR | Status: AC
  Administered 2023-03-03: 25 mg via INTRAVENOUS
  Filled 2023-03-03: qty 1

## 2023-03-03 MED ORDER — VANCOMYCIN HCL 1250 MG/250ML IV SOLN
1250.0000 mg | Freq: Once | INTRAVENOUS | Status: AC
  Administered 2023-03-03: 1250 mg via INTRAVENOUS
  Filled 2023-03-03: qty 250

## 2023-03-03 MED ORDER — ESCITALOPRAM OXALATE 10 MG PO TABS
5.0000 mg | ORAL_TABLET | Freq: Every day | ORAL | Status: DC
  Filled 2023-03-03 (×2): qty 1

## 2023-03-03 MED ORDER — ACETAMINOPHEN 325 MG PO TABS: 650.0000 mg | ORAL_TABLET | Freq: Four times a day (QID) | ORAL | Status: DC | PRN

## 2023-03-03 MED ORDER — SODIUM CHLORIDE 0.9 % IV SOLN
2.0000 g | Freq: Three times a day (TID) | INTRAVENOUS | Status: DC
  Administered 2023-03-04: 2 g via INTRAVENOUS
  Filled 2023-03-03 (×2): qty 12.5

## 2023-03-03 NOTE — Progress Notes (Signed)
Pharmacy Antibiotic Note  Ashley Humphrey is a 22 y.o. female admitted on 03/03/2023 with infected lesions.  Pharmacy has been consulted for Vancomycin dosing.  Plan: Vancomycin 1250 mg iv x 1 now then Vancomycin 1 gram iv Q 12 hours (AUC of 525 using Scr 0.61)  Also on Cefepime Follow Scr, progress, LOT  Height: 5\' 3"  (160 cm) Weight: 65.8 kg (145 lb) IBW/kg (Calculated) : 52.4  Temp (24hrs), Avg:99 F (37.2 C), Min:98.6 F (37 C), Max:99.3 F (37.4 C)  Recent Labs  Lab 03/03/23 1824  WBC 12.5*  CREATININE 0.61    Estimated Creatinine Clearance: 100.6 mL/min (by C-G formula based on SCr of 0.61 mg/dL).    No Known Allergies  Thank you Okey Regal, PharmD 03/03/2023 7:20 PM

## 2023-03-03 NOTE — ED Provider Notes (Signed)
[place] EMERGENCY DEPARTMENT AT [facility] Provider Note  CSN: [id number] Arrival date & time: 03/03/23 1721  Chief Complaint(s) Abnormal Lab  HPI Ashley Humphrey is a 22 y.o. female with PMH hidradenitis suppurativa, iron deficiency, migraine disorder who presents emergency room for evaluation of axillary and groin abscesses.  Patient was previously on Humira injections with [facility] dermatology for approximately 1 year and remained on them throughout her pregnancy but then discontinued them in her third trimester.  She delivered approximately 4 and half months ago and has not restarted her Humira until this month.  She states that the wounds under her arms and legs have significantly worsened and she needs to change her shirt approximately 4 times a day because of this.  She establish care with a new primary care physician who was highly concerned for infected wounds and sent her to the emergency department for further evaluation.   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 Authorizing Provider  adalimumab (HUMIRA, 2 PEN,) 40 MG/0.8ML PNKT pen Inject 40 mg into the skin once a week.    [provider]  escitalopram (LEXAPRO) 5 MG tablet Take 1 tablet (5 mg total) by mouth daily. 03/03/23   [place] Vena Austria, NP                                                                                                                                    Past Surgical History Past Surgical History:  Procedure Laterality Date   NO PAST SURGERIES     Family History History reviewed. No pertinent family history.  Social History Social History   Tobacco Use   Smoking status: Never   Smokeless tobacco: Never  Vaping Use   Vaping Use: Never used  Substance Use Topics   Alcohol use: Never   Drug use: Never   Allergies Patient has no known allergies.  Review of Systems Review of Systems  Skin:  Positive for wound.    Physical Exam Vital Signs  I have reviewed the triage vital signs BP 129/72 (BP Location: Right Arm)   Pulse (!) 131   Temp 99.3 F (37.4 C) (Oral)   Resp 16   Ht 5\' 3"  (1.6 m)  Wt 65.8 kg   LMP 12/23/2022 (Exact Date)   SpO2 95%   BMI 25.69 kg/m   Physical Exam Vitals and nursing note reviewed.  Constitutional:      General: She is not in acute distress.    Appearance: She is well-developed.  HENT:     Head: Normocephalic and atraumatic.  Eyes:     Conjunctiva/sclera: Conjunctivae normal.  Cardiovascular:     Rate and Rhythm: Normal rate and regular rhythm.     Heart sounds: No murmur heard. Pulmonary:     Effort: Pulmonary effort is normal. No respiratory distress.     Breath sounds: Normal breath sounds.  Abdominal:     Palpations: Abdomen is soft.     Tenderness: There is no abdominal tenderness.  Musculoskeletal:        General: No swelling.     Cervical back: Neck supple.  Skin:    General: Skin is warm and dry.     Capillary Refill: Capillary refill takes less than 2 seconds.     Findings: Lesion and rash present.  Neurological:     Mental Status: She is alert.  Psychiatric:        Mood and Affect: Mood normal.     ED Results and Treatments Labs (all labs ordered are listed, but only abnormal results are displayed) Labs Reviewed  CBC  WITH DIFFERENTIAL/PLATELET - Abnormal; Notable for the following components:      Result Value   WBC 12.5 (*)    Hemoglobin 10.0 (*)    HCT 32.9 (*)    MCV 76.2 (*)    MCH 23.1 (*)    Platelets 567 (*)    Neutro Abs 10.0 (*)    Abs Immature Granulocytes 0.09 (*)    All other components within normal limits  COMPREHENSIVE METABOLIC PANEL - Abnormal; Notable for the following components:   Sodium 134 (*)    Potassium 3.4 (*)    Glucose, Bld 105 (*)    Total Protein 8.5 (*)    Albumin 3.3 (*)    All other components within normal limits  URINALYSIS, ROUTINE W REFLEX MICROSCOPIC - Abnormal; Notable for the following components:   APPearance HAZY (*)    Hgb urine dipstick MODERATE (*)    Leukocytes,Ua MODERATE (*)    Bacteria, UA RARE (*)    All other components within normal limits  PREGNANCY, URINE                                                                                                                          Radiology No results found.  Pertinent labs & imaging results that were available during my care of the patient were reviewed by me and considered in my medical decision making (see MDM for details).  Medications Ordered in ED Medications  vancomycin (VANCOREADY) IVPB 1250 mg/250 mL (1,250 mg Intravenous New Bag/Given 03/03/23 1958)  ceFEPIme (MAXIPIME) 2 g in sodium chloride 0.9 % 100 mL IVPB (  has no administration in time range)  vancomycin (VANCOCIN) IVPB 1000 mg/200 mL premix (has no administration in time range)  ceFEPIme (MAXIPIME) 2 g in sodium chloride 0.9 % 100 mL IVPB (0 g Intravenous Stopped 03/03/23 1953)                                                                                                                                     Procedures Procedures  (including critical care time)  Medical Decision Making / ED Course   This patient presents to the ED for concern of multiple abscesses, this involves an extensive number of treatment options,  and is a complaint that carries with it a high risk of complications and morbidity.  The differential diagnosis includes hidradenitis suppurativa, infected wounds, bacteremia, failure of outpatient regimen  MDM: Patient seen emergency room for evaluation of multiple infected wounds.  Physical exam with multiple open wounds over the axilla bilaterally, under a small pannus and in the skin folds of the groin bilaterally.  Wounds significantly worse up in the axilla.  Patient arrives very tachycardic with rates in the 130s with a leukocytosis to 12.5.  Broad-spectrum antibiotics initiated.  I did speak with the dermatology resident on-call at [facility] Dr. Bradly Chris as the [facility] system does not have dermatology services available across any sites and they recommend antibiotics and outpatient follow-up to discuss changing her biologic medications if necessary.  Not recommending transfer to the facility at this time.  Patient then admitted on broad-spectrum antibiotics to medicine.   Additional history obtained: -Additional history obtained from mother -External records from outside source obtained and reviewed including: Chart review including previous notes, labs, imaging, consultation notes   Lab Tests: -I ordered, reviewed, and interpreted labs.   The pertinent results include:   Labs Reviewed  CBC WITH DIFFERENTIAL/PLATELET - Abnormal; Notable for the following components:      Result Value   WBC 12.5 (*)    Hemoglobin 10.0 (*)    HCT 32.9 (*)    MCV 76.2 (*)    MCH 23.1 (*)    Platelets 567 (*)    Neutro Abs 10.0 (*)    Abs Immature Granulocytes 0.09 (*)    All other components within normal limits  COMPREHENSIVE METABOLIC PANEL - Abnormal; Notable for the following components:   Sodium 134 (*)    Potassium 3.4 (*)    Glucose, Bld 105 (*)    Total Protein 8.5 (*)    Albumin 3.3 (*)    All other components within normal limits  URINALYSIS, ROUTINE W REFLEX MICROSCOPIC -  Abnormal; Notable for the following components:   APPearance HAZY (*)    Hgb urine dipstick MODERATE (*)    Leukocytes,Ua MODERATE (*)    Bacteria, UA RARE (*)    All other components within normal limits  PREGNANCY, URINE      Medicines ordered and prescription drug  management: Meds ordered this encounter  Medications   ceFEPIme (MAXIPIME) 2 g in sodium chloride 0.9 % 100 mL IVPB   vancomycin (VANCOREADY) IVPB 1250 mg/250 mL    Order Specific Question:   Indication:    Answer:   Wound Infection   ceFEPIme (MAXIPIME) 2 g in sodium chloride 0.9 % 100 mL IVPB    Order Specific Question:   Antibiotic Indication:    Answer:   Wound Infection   vancomycin (VANCOCIN) IVPB 1000 mg/200 mL premix    Order Specific Question:   Indication:    Answer:   Wound Infection    -I have reviewed the patients home medicines and have made adjustments as needed  Critical interventions none  Consultations Obtained: I requested consultation with the dermatology resident on-call [facility] Dr. Bradly Chris,  and discussed lab and imaging findings as well as pertinent plan - they recommend: IV antibiotics for now, outpatient follow-up when discharged   Cardiac Monitoring: The patient was maintained on a cardiac monitor.  I personally viewed and interpreted the cardiac monitored which showed an underlying rhythm of: Sinus tachycardia  Social Determinants of Health:  Factors impacting patients care include: none   Reevaluation: After the interventions noted above, I reevaluated the patient and found that they have :improved  Co morbidities that complicate the patient evaluation  Past Medical History:  Diagnosis Date   Allergic rhinitis    Dysmenorrhea    Insomnia    Iron deficiency    Migraine    Mild intermittent asthma       Dispostion: I considered admission for this patient, and due to infected wounds associated with hidradenitis suppurativa requiring IV antibiotics patient  require hospital admission     Final Clinical Impression(s) / ED Diagnoses Final diagnoses:  None     @PCDICTATION @    Glendora Score, MD 03/04/23 1528

## 2023-03-03 NOTE — ED Notes (Signed)
ED TO INPATIENT HANDOFF REPORT  ED Nurse Name and Phone #: Pamula Luther, RN  S Name/Age/Gender Ashley Humphrey 22 y.o. female Room/Bed: APA03/APA03  Code Status   Code Status: Full Code  Home/SNF/Other Home Patient oriented to: self, place, time, and situation Is this baseline? Yes   Triage Complete: Triage complete  Chief Complaint Cellulitis [L03.90]  Triage Note Pt sent by PCP due to infected hydradenitis suppurativa lesions under both arms, across chest, under buttocks, and between legs. Pain rated 10/10 at pressure points. Pt currently takes humira injections with last dose received 2 weeks ago (missed dose last week).    Allergies No Known Allergies  Level of Care/Admitting Diagnosis ED Disposition     ED Disposition  Admit   Condition  --   Comment  Hospital Area: [facility] [[id number]]  Level of Care: Med-Surg [16]  Covid Evaluation: Asymptomatic - no recent exposure (last 10 days) testing not required  Diagnosis: Cellulitis [161096]  Admitting Physician: Lilyan Gilford [[id number]]  Attending Physician: Lilyan Gilford [[id number]]          B Medical/Surgery History Past Medical History:  Diagnosis Date   Allergic rhinitis    Dysmenorrhea    Insomnia    Iron deficiency    Migraine    Mild intermittent asthma    Past Surgical History:  Procedure Laterality Date   NO PAST SURGERIES       A IV Location/Drains/Wounds Patient Lines/Drains/Airways Status     Active Line/Drains/Airways     Name Placement date Placement time Site Days   Peripheral IV 03/03/23 22 G Right Antecubital 03/03/23  1816  Antecubital  less than 1            Intake/Output Last 24 hours  Intake/Output Summary (Last 24 hours) at 03/03/2023 2127 Last data filed at 03/03/2023 1953 Gross per 24 hour  Intake 100 ml  Output --  Net 100 ml    Labs/Imaging Results for orders placed or performed during the hospital encounter of 03/03/23 (from the past 48  hour(s))  CBC with Differential     Status: Abnormal   Collection Time: 03/03/23  6:24 PM  Result Value Ref Range   WBC 12.5 (H) 4.0 - 10.5 K/uL   RBC 4.32 3.87 - 5.11 MIL/uL   Hemoglobin 10.0 (L) 12.0 - 15.0 g/dL   HCT 29.5 (L) 62.1 - 30.8 %   MCV 76.2 (L) 80.0 - 100.0 fL   MCH 23.1 (L) 26.0 - 34.0 pg   MCHC 30.4 30.0 - 36.0 g/dL   RDW 65.7 84.6 - 96.2 %   Platelets 567 (H) 150 - 400 K/uL   nRBC 0.0 0.0 - 0.2 %   Neutrophils Relative % 80 %   Neutro Abs 10.0 (H) 1.7 - 7.7 K/uL   Lymphocytes Relative 13 %   Lymphs Abs 1.6 0.7 - 4.0 K/uL   Monocytes Relative 5 %   Monocytes Absolute 0.6 0.1 - 1.0 K/uL   Eosinophils Relative 1 %   Eosinophils Absolute 0.1 0.0 - 0.5 K/uL   Basophils Relative 0 %   Basophils Absolute 0.1 0.0 - 0.1 K/uL   Immature Granulocytes 1 %   Abs Immature Granulocytes 0.09 (H) 0.00 - 0.07 K/uL    Comment: Performed at [facility], [address] [place], [place] [address]  Comprehensive metabolic panel     Status: Abnormal   Collection Time: 03/03/23  6:24 PM  Result Value Ref Range   Sodium 134 (L)  135 - 145 mmol/L   Potassium 3.4 (L) 3.5 - 5.1 mmol/L   Chloride 101 98 - 111 mmol/L   CO2 24 22 - 32 mmol/L   Glucose, Bld 105 (H) 70 - 99 mg/dL    Comment: Glucose reference range applies only to samples taken after fasting for at least 8 hours.   BUN 8 6 - 20 mg/dL   Creatinine, Ser 9.60 0.44 - 1.00 mg/dL   Calcium 8.9 8.9 - 45.4 mg/dL   Total Protein 8.5 (H) 6.5 - 8.1 g/dL   Albumin 3.3 (L) 3.5 - 5.0 g/dL   AST 18 15 - 41 U/L   ALT 12 0 - 44 U/L   Alkaline Phosphatase 95 38 - 126 U/L   Total Bilirubin 0.5 0.3 - 1.2 mg/dL   GFR, Estimated >09 >81 mL/min    Comment: (NOTE) Calculated using the CKD-EPI Creatinine Equation (2021)    Anion gap 9 5 - 15    Comment: Performed at [facility], [address], [place], [place] [address]  Urinalysis, Routine w reflex microscopic -Urine, Clean Catch     Status: Abnormal   Collection Time: 03/03/23  6:26 PM   Result Value Ref Range   Color, Urine YELLOW YELLOW   APPearance HAZY (A) CLEAR   Specific Gravity, Urine 1.017 1.005 - 1.030   pH 5.0 5.0 - 8.0   Glucose, UA NEGATIVE NEGATIVE mg/dL   Hgb urine dipstick MODERATE (A) NEGATIVE   Bilirubin Urine NEGATIVE NEGATIVE   Ketones, ur NEGATIVE NEGATIVE mg/dL   Protein, ur NEGATIVE NEGATIVE mg/dL   Nitrite NEGATIVE NEGATIVE   Leukocytes,Ua MODERATE (A) NEGATIVE   RBC / HPF 0-5 0 - 5 RBC/hpf   WBC, UA 21-50 0 - 5 WBC/hpf   Bacteria, UA RARE (A) NONE SEEN   Squamous Epithelial / HPF 6-10 0 - 5 /HPF   Mucus PRESENT     Comment: Performed at [facility], [address], [place], [place] [address]  Pregnancy, urine     Status: None   Collection Time: 03/03/23  6:26 PM  Result Value Ref Range   Preg Test, Ur NEGATIVE NEGATIVE    Comment:        THE SENSITIVITY OF THIS METHODOLOGY IS >25 mIU/mL. Performed at [facility], [address] [place], [place] [address]    No results found.  Pending Labs Unresulted Labs (From admission, onward)     Start     Ordered   03/03/23 2111  Blood culture (routine x 2)  BLOOD CULTURE X 2,   R (with TIMED occurrences)      03/03/23 2110   Signed and Held  HIV Antibody (routine testing w rflx)  (HIV Antibody (Routine testing w reflex) panel)  Once,   R        Signed and Held   Signed and Held  Comprehensive metabolic panel  Tomorrow morning,   R        Signed and Held   Signed and Held  Magnesium  Tomorrow morning,   R        Signed and Held   Signed and Held  CBC with Differential/Platelet  Tomorrow morning,   R        Signed and Held            Vitals/Pain Today's Vitals   03/03/23 1806 03/03/23 1826 03/03/23 2123 03/03/23 2126  BP: 129/72  116/74   Pulse: (!) 131  (!) 122   Resp: 16  17  Temp: 99.3 F (37.4 C)   98.4 F (36.9 C)  TempSrc: Oral   Oral  SpO2: 95%  97%   Weight:      Height:      PainSc:  10-Worst pain ever      Isolation Precautions No active  isolations  Medications Medications  vancomycin (VANCOREADY) IVPB 1250 mg/250 mL (1,250 mg Intravenous New Bag/Given 03/03/23 1958)  ceFEPIme (MAXIPIME) 2 g in sodium chloride 0.9 % 100 mL IVPB (has no administration in time range)  vancomycin (VANCOCIN) IVPB 1000 mg/200 mL premix (has no administration in time range)  sodium chloride 0.9 % bolus 500 mL (has no administration in time range)  ceFEPIme (MAXIPIME) 2 g in sodium chloride 0.9 % 100 mL IVPB (0 g Intravenous Stopped 03/03/23 1953)  diphenhydrAMINE (BENADRYL) injection 25 mg (25 mg Intravenous Given 03/03/23 2101)    Mobility walks     Focused Assessments Skin.   R Recommendations: See Admitting Provider Note  Report given to:   Additional Notes: reported itching during IV Vancomycin administration. IV Benadryl effective. Denies SOB.

## 2023-03-03 NOTE — ED Notes (Addendum)
Pt called out wanting to speak with this RN. Pt c/o itching from Vancomycin. EDP at bedside and told this RN to slow infusion rate. EDP to order medications for patients itching. Will continue to monitor pt.

## 2023-03-03 NOTE — Progress Notes (Signed)
New Patient Office Visit  Subjective    Patient ID: Ashley Humphrey, female    DOB: [birth year]  Age: 22 y.o. MRN: [id number]  CC:  Chief Complaint  Patient presents with   Establish Care   Chest Pain    Has been going on for 4-5 months. Has open hole on chest? Been there for a week?     HPI Ashley Humphrey presents with her mother to establish care with a physical.  Past medical history of hidradenitis suppurative.  She was getting Humira injection however she had to stop due to her recent pregnancy.  She has abscess on bilateral axillary, chest area buttocks that appear infected. Right axillary with green discharged for 55-months.  She will that she has a change in products 2-3 times a day due to the amount of discharge from bilateral axillary She is para 2, gravida 23 years old  girl, and 47-month old boys normal delivery both 37 weeks.she is not breast feeding.  Mom reports that she has been dealing with fever/chills overnight and has to change close at least twice throughout the night.  We will call to culture the right armpit and client is instructed to go to emergency department for possible IV antibiotic.  She is very tearful during the encounter and reports that she has been keeping to herself due to her chronic condition "I cannot go to work I cannot do anything it is uncomfortable"  Outpatient Encounter Medications as of 03/03/2023  Medication Sig   adalimumab (HUMIRA, 2 PEN,) 40 MG/0.8ML PNKT pen Inject 40 mg into the skin once a week.   escitalopram (LEXAPRO) 5 MG tablet Take 1 tablet (5 mg total) by mouth daily.   No facility-administered encounter medications on file as of 03/03/2023.    Past Medical History:  Diagnosis Date   Allergic rhinitis    Dysmenorrhea    Insomnia    Iron deficiency    Migraine    Mild intermittent asthma     Past Surgical History:  Procedure Laterality Date   NO PAST SURGERIES      History reviewed. No pertinent family  history.  Social History   Socioeconomic History   Marital status: Single    Spouse name: Not on file   Number of children: Not on file   Years of education: Not on file   Highest education level: Not on file  Occupational History   Not on file  Tobacco Use   Smoking status: Never   Smokeless tobacco: Never  Vaping Use   Vaping Use: Never used  Substance and Sexual Activity   Alcohol use: Never   Drug use: Never   Sexual activity: Yes    Partners: Male  Other Topics Concern   Not on file  Social History Narrative   Not on file   Social Determinants of Health   Financial Resource Strain: Not on file  Food Insecurity: Not on file  Transportation Needs: Not on file  Physical Activity: Not on file  Stress: Not on file  Social Connections: Not on file  Intimate Partner Violence: Not on file    ROS Negative unless indicated in HPI   Objective    BP 110/75   Pulse (!) 123   Temp 98.6 F (37 C) (Temporal)   Ht 5\' 3"  (1.6 m)   Wt 143 lb 3.2 oz (65 kg)   LMP 12/23/2022   SpO2 96%   BMI 25.37 kg/m   Physical Exam Vitals  and nursing note reviewed.  Constitutional:      Appearance: She is well-developed.  HENT:     Head: Normocephalic and atraumatic.  Cardiovascular:     Rate and Rhythm: Normal rate.     Heart sounds: Normal heart sounds.  Pulmonary:     Effort: Pulmonary effort is normal.     Breath sounds: Normal breath sounds.  Abdominal:     General: Bowel sounds are normal.     Palpations: Abdomen is soft.  Skin:    Findings: Abscess present. Rash is not pustular.          Comments: Pustular abscess on bilateral axilla, buttock with purulent drainage  Neurological:     General: No focal deficit present.     Mental Status: She is alert. Mental status is at baseline.  Psychiatric:        Attention and Perception: Attention and perception normal.        Mood and Affect: Affect is tearful.        Speech: Speech normal.        Behavior: Behavior  normal. Behavior is cooperative.        Thought Content: Thought content normal. Thought content does not include suicidal ideation. Thought content does not include suicidal plan.        Cognition and Memory: Cognition and memory normal.        Judgment: Judgment normal. Judgment is not impulsive or inappropriate.      Assessment & Plan:  Encounter to establish care -     Bayer DCA Hb A1c Waived -     Bayer DCA Hb A1c Waived  Hidradenitis suppurativa of multiple sites  Chest pain, unspecified type -     EKG 12-Lead  Anxiety and depression -     Escitalopram Oxalate; Take 1 tablet (5 mg total) by mouth daily.  Dispense: 90 tablet; Refill: 0  Routine medical exam -     CBC with Differential/Platelet -     CMP14+EGFR -     Lipid panel -     Thyroid Panel With TSH -     Bayer DCA Hb A1c Waived  Vulval hidradenitis suppurativa  Anemia during pregnancy in third trimester -     Iron, TIBC and Ferritin Panel  Open wound -     WOUND CULTURE   Hidradenitis suppurative: wound culture obtain, client to go to ED  Anemia: iron Panel order Anxiety/depression start Lexapro 5 mg 1-tab daily Will consider referral to counseling at the next visit Client ot go to [facility] for [facility]   Return in about 6 weeks (around 04/14/2023) for follow-up to check hidradenitis suppurativa.   Arrie Aran Santa Lighter, DNP [facility] [address] [place], [place] [address] [phone]

## 2023-03-03 NOTE — ED Triage Notes (Signed)
Pt sent by PCP due to infected hydradenitis suppurativa lesions under both arms, across chest, under buttocks, and between legs. Pain rated 10/10 at pressure points. Pt currently takes humira injections with last dose received 2 weeks ago (missed dose last week).

## 2023-03-04 ENCOUNTER — Observation Stay (HOSPITAL_BASED_OUTPATIENT_CLINIC_OR_DEPARTMENT_OTHER): Payer: Medicaid Other

## 2023-03-04 DIAGNOSIS — F32A Depression, unspecified: Secondary | ICD-10-CM | POA: Diagnosis not present

## 2023-03-04 DIAGNOSIS — L03818 Cellulitis of other sites: Secondary | ICD-10-CM | POA: Diagnosis not present

## 2023-03-04 DIAGNOSIS — E876 Hypokalemia: Secondary | ICD-10-CM | POA: Diagnosis not present

## 2023-03-04 DIAGNOSIS — L732 Hidradenitis suppurativa: Secondary | ICD-10-CM

## 2023-03-04 DIAGNOSIS — F419 Anxiety disorder, unspecified: Secondary | ICD-10-CM | POA: Diagnosis not present

## 2023-03-04 DIAGNOSIS — R011 Cardiac murmur, unspecified: Secondary | ICD-10-CM | POA: Diagnosis not present

## 2023-03-04 LAB — CBC WITH DIFFERENTIAL/PLATELET
Abs Immature Granulocytes: 0.19 10*3/uL — ABNORMAL HIGH (ref 0.00–0.07)
Basophils Absolute: 0.1 10*3/uL (ref 0.0–0.2)
Basophils Absolute: 0 10*3/uL (ref 0.0–0.1)
Basophils Relative: 0 %
Basos: 0 %
EOS (ABSOLUTE): 0.1 10*3/uL (ref 0.0–0.4)
Eos: 1 %
Eosinophils Absolute: 0.2 10*3/uL (ref 0.0–0.5)
Eosinophils Relative: 2 %
HCT: 28.9 % — ABNORMAL LOW (ref 36.0–46.0)
Hematocrit: 30.8 % — ABNORMAL LOW (ref 34.0–46.6)
Hemoglobin: 9.6 g/dL — ABNORMAL LOW (ref 11.1–15.9)
Hemoglobin: 8.5 g/dL — ABNORMAL LOW (ref 12.0–15.0)
Immature Grans (Abs): 0.1 10*3/uL (ref 0.0–0.1)
Immature Granulocytes: 1 %
Immature Granulocytes: 2 %
Lymphocytes Absolute: 1.3 10*3/uL (ref 0.7–3.1)
Lymphocytes Relative: 17 %
Lymphs Abs: 1.8 10*3/uL (ref 0.7–4.0)
Lymphs: 12 %
MCH: 22.9 pg — ABNORMAL LOW (ref 26.6–33.0)
MCH: 22.9 pg — ABNORMAL LOW (ref 26.0–34.0)
MCHC: 31.2 g/dL — ABNORMAL LOW (ref 31.5–35.7)
MCHC: 29.4 g/dL — ABNORMAL LOW (ref 30.0–36.0)
MCV: 74 fL — ABNORMAL LOW (ref 79–97)
MCV: 77.9 fL — ABNORMAL LOW (ref 80.0–100.0)
Monocytes Absolute: 0.5 10*3/uL (ref 0.1–0.9)
Monocytes Absolute: 0.5 10*3/uL (ref 0.1–1.0)
Monocytes Relative: 5 %
Monocytes: 5 %
Neutro Abs: 7.7 10*3/uL (ref 1.7–7.7)
Neutrophils Absolute: 9.1 10*3/uL — ABNORMAL HIGH (ref 1.4–7.0)
Neutrophils Relative %: 74 %
Neutrophils: 81 %
Platelets: 616 10*3/uL — ABNORMAL HIGH (ref 150–450)
Platelets: 507 10*3/uL — ABNORMAL HIGH (ref 150–400)
RBC: 4.19 x10E6/uL (ref 3.77–5.28)
RBC: 3.71 MIL/uL — ABNORMAL LOW (ref 3.87–5.11)
RDW: 14.4 % (ref 11.7–15.4)
RDW: 14.6 % (ref 11.5–15.5)
WBC: 11.2 10*3/uL — ABNORMAL HIGH (ref 3.4–10.8)
WBC: 10.4 10*3/uL (ref 4.0–10.5)
nRBC: 0 % (ref 0.0–0.2)

## 2023-03-04 LAB — RETICULOCYTES
Immature Retic Fract: 26.7 % — ABNORMAL HIGH (ref 2.3–15.9)
RBC.: 3.73 MIL/uL — ABNORMAL LOW (ref 3.87–5.11)
Retic Count, Absolute: 55.2 10*3/uL (ref 19.0–186.0)
Retic Ct Pct: 1.5 % (ref 0.4–3.1)

## 2023-03-04 LAB — CMP14+EGFR
ALT: 12 IU/L (ref 0–32)
AST: 13 IU/L (ref 0–40)
Albumin: 3.7 g/dL — ABNORMAL LOW (ref 4.0–5.0)
Alkaline Phosphatase: 115 IU/L (ref 44–121)
BUN/Creatinine Ratio: 12 (ref 9–23)
BUN: 7 mg/dL (ref 6–20)
Bilirubin Total: <0.2 mg/dL (ref 0.0–1.2)
CO2: 23 mmol/L (ref 20–29)
Calcium: 9.1 mg/dL (ref 8.7–10.2)
Chloride: 100 mmol/L (ref 96–106)
Creatinine, Ser: 0.58 mg/dL (ref 0.57–1.00)
Globulin, Total: 3.9 g/dL (ref 1.5–4.5)
Glucose: 94 mg/dL (ref 70–99)
Potassium: 4.4 mmol/L (ref 3.5–5.2)
Sodium: 137 mmol/L (ref 134–144)
Total Protein: 7.6 g/dL (ref 6.0–8.5)
eGFR: 131 mL/min/{1.73_m2} (ref >59)

## 2023-03-04 LAB — ECHOCARDIOGRAM COMPLETE
Area-P 1/2: 3.89 cm2
Height: 63 in
MV M vel: 1.09 m/s
MV Peak grad: 4.7 mmHg
S' Lateral: 2.8 cm
Weight: 2329.6 oz

## 2023-03-04 LAB — COMPREHENSIVE METABOLIC PANEL
ALT: 12 U/L (ref 0–44)
AST: 13 U/L — ABNORMAL LOW (ref 15–41)
Albumin: 2.7 g/dL — ABNORMAL LOW (ref 3.5–5.0)
Alkaline Phosphatase: 76 U/L (ref 38–126)
Anion gap: 6 (ref 5–15)
BUN: 8 mg/dL (ref 6–20)
CO2: 25 mmol/L (ref 22–32)
Calcium: 8.5 mg/dL — ABNORMAL LOW (ref 8.9–10.3)
Chloride: 103 mmol/L (ref 98–111)
Creatinine, Ser: 0.58 mg/dL (ref 0.44–1.00)
GFR, Estimated: >60 mL/min (ref >60)
Glucose, Bld: 96 mg/dL (ref 70–99)
Potassium: 3.8 mmol/L (ref 3.5–5.1)
Sodium: 134 mmol/L — ABNORMAL LOW (ref 135–145)
Total Bilirubin: 0.4 mg/dL (ref 0.3–1.2)
Total Protein: 7.2 g/dL (ref 6.5–8.1)

## 2023-03-04 LAB — IRON AND TIBC
Iron: 15 ug/dL — ABNORMAL LOW (ref 28–170)
Saturation Ratios: 9 % — ABNORMAL LOW (ref 10.4–31.8)
TIBC: 162 ug/dL — ABNORMAL LOW (ref 250–450)
UIBC: 147 ug/dL

## 2023-03-04 LAB — THYROID PANEL WITH TSH
Free Thyroxine Index: 2.5 (ref 1.2–4.9)
T3 Uptake Ratio: 32 % (ref 24–39)
T4, Total: 7.9 ug/dL (ref 4.5–12.0)
TSH: 0.87 u[IU]/mL (ref 0.450–4.500)

## 2023-03-04 LAB — IRON,TIBC AND FERRITIN PANEL
Ferritin: 257 ng/mL — ABNORMAL HIGH (ref 15–150)
Iron Saturation: 7 % — CL (ref 15–55)
Iron: 13 ug/dL — ABNORMAL LOW (ref 27–159)
Total Iron Binding Capacity: 174 ug/dL — ABNORMAL LOW (ref 250–450)
UIBC: 161 ug/dL (ref 131–425)

## 2023-03-04 LAB — FERRITIN: Ferritin: 132 ng/mL (ref 11–307)

## 2023-03-04 LAB — LIPID PANEL
Chol/HDL Ratio: 5.5 ratio — ABNORMAL HIGH (ref 0.0–4.4)
Cholesterol, Total: 154 mg/dL (ref 100–199)
HDL: 28 mg/dL — ABNORMAL LOW (ref >39)
LDL Chol Calc (NIH): 111 mg/dL — ABNORMAL HIGH (ref 0–99)
Triglycerides: 77 mg/dL (ref 0–149)
VLDL Cholesterol Cal: 15 mg/dL (ref 5–40)

## 2023-03-04 LAB — VITAMIN B12: Vitamin B-12: 645 pg/mL (ref 180–914)

## 2023-03-04 LAB — HIV ANTIBODY (ROUTINE TESTING W REFLEX): HIV Screen 4th Generation wRfx: NONREACTIVE

## 2023-03-04 LAB — FOLATE: Folate: 3.8 ng/mL — ABNORMAL LOW (ref >5.9)

## 2023-03-04 LAB — CULTURE, BLOOD (ROUTINE X 2): Culture: NO GROWTH

## 2023-03-04 LAB — MAGNESIUM: Magnesium: 2.2 mg/dL (ref 1.7–2.4)

## 2023-03-04 MED ORDER — LACTINEX PO CHEW: 1.0000 | CHEWABLE_TABLET | Freq: Three times a day (TID) | ORAL | 0 refills | Status: AC

## 2023-03-04 MED ORDER — POTASSIUM CHLORIDE 10 MEQ/100ML IV SOLN
10.0000 meq | INTRAVENOUS | Status: AC
  Administered 2023-03-04 (×2): 10 meq via INTRAVENOUS
  Filled 2023-03-04 (×2): qty 100

## 2023-03-04 MED ORDER — KETOROLAC TROMETHAMINE 15 MG/ML IJ SOLN: 15.0000 mg | Freq: Four times a day (QID) | INTRAMUSCULAR | Status: DC | PRN

## 2023-03-04 MED ORDER — FLUCONAZOLE 150 MG PO TABS: 150.0000 mg | ORAL_TABLET | Freq: Every day | ORAL | 2 refills | Status: AC

## 2023-03-04 MED ORDER — CLINDAMYCIN HCL 300 MG PO CAPS: 300.0000 mg | ORAL_CAPSULE | Freq: Three times a day (TID) | ORAL | 0 refills | Status: AC

## 2023-03-04 MED ORDER — DIPHENHYDRAMINE HCL 50 MG/ML IJ SOLN
25.0000 mg | Freq: Once | INTRAMUSCULAR | Status: AC
  Administered 2023-03-04: 25 mg via INTRAVENOUS
  Filled 2023-03-04: qty 1

## 2023-03-04 MED ORDER — SODIUM CHLORIDE 0.9 % IV SOLN: INTRAVENOUS | Status: DC

## 2023-03-04 MED ORDER — CLINDAMYCIN HCL 150 MG PO CAPS
300.0000 mg | ORAL_CAPSULE | Freq: Four times a day (QID) | ORAL | Status: DC
  Filled 2023-03-04: qty 2

## 2023-03-04 NOTE — Discharge Summary (Signed)
Physician Discharge Summary   Patient: Ashley Humphrey MRN: [id number] DOB: December 24, 2000  Admit date:     03/03/2023  Discharge date: 03/04/23  Discharge Physician: Kendell Bane   PCP: Martina Sinner, NP   Recommendations at discharge:   Wound care: cleanse with NS, pat dry. Cover/fill with silver hydrofiber (Aquacel Ag+ Advantage, [employer] # [id number]), top with ABD pads and secure.Change daily. May moisten hydrofiber with NS prior to removal if adherent.  Daily wound care Follow-up with dermatologist soon as possible Continue current antibiotics  Discharge Diagnoses: Principal Problem:   Cellulitis Active Problems:   Anxiety and depression   Hidradenitis suppurativa of multiple sites   Hypokalemia  Resolved Problems:   * No resolved hospital problems. *  Hospital Course: [name] is a 22 y.o. female with medical history significant of hidradenitis suppurativa, anxiety and depression, and more presents the ED with a chief complaint of infected spots on her arms and legs.  Patient reports that these particular lesions started 1.5 years ago.  She reports she was on Humira for 7 months, she came off of it for her third trimester of her pregnancy, and restarted it 2 weeks ago.  Since has been restarted she has noticed no change.  She reports that the lesions are painful and describes the pain as burning.  She has not tried anything for the pain at home like Tylenol or ibuprofen.  She reports no fever.  Lesions are nondraining yellow fluid.  Sometimes lesions or bleeding.  There is not any bleeding today.  She has not had any antibiotics outpatient for this problem.  She reports no 1 area is bothering her more than the other but the left axilla and the right groin seem to be worse than the contralateral sides.  Patient has no other complaints at this time.   Patient does not smoke, does not drink, does not use illicit drugs.  She did not get vaccinated for COVID or flu.   Patient is full code.  ED Temp 98.6, heart rate 123-131, respiratory 16, blood pressure 110/72-129/75, maintaining oxygen sats on room air Leukocytosis at 12.5, hemoglobin 10.0 with known history of iron deficiency anemia Thrombocytosis at 567 which is likely acute phase reactant Chemistry reveals a hypokalemia at 3.4 UA is not indicative of UTI Cefepime, bank, Benadryl started in the ED Derm at [facility] was consulted and just recommended antibiotics Admission requested for infected hidradenitis suppurativa  Assessment and Plan: * Cellulitis - Cellulitis surrounding fluctuant abscesses - Patient started on vancomycin and cefepime in the ED>>> was to p.o. clindamycin  - Blood cultures growth to date - Status post IV fluid hydration - Continue pain control  Hypokalemia - Potassium 3.4 - Replace with 20 mEq of potassium   Hidradenitis suppurativa of multiple sites - Involving bilateral axilla and bilateral groin - Had been on Humira prior to pregnancy - Restarted Humira 2 weeks ago - Derm at [facility] was consulted by phone and advised that they would not do anything other than antibiotics,    - Consult wound care    Consultants: Requires dermatology Procedures performed: Wound care Disposition: Home Diet recommendation:  Discharge Diet Orders (From admission, onward)     Start     Ordered   03/04/23 0000  Diet - low sodium heart healthy        03/04/23 1230           Regular diet DISCHARGE MEDICATION: Allergies as of 03/04/2023  No Known Allergies      Medication List     TAKE these medications    clindamycin 300 MG capsule Commonly known as: CLEOCIN Take 1 capsule (300 mg total) by mouth 3 (three) times daily for 14 days.   escitalopram 5 MG tablet Commonly known as: Lexapro Take 1 tablet (5 mg total) by mouth daily.   fluconazole 150 MG tablet Commonly known as: Diflucan Take 1 tablet (150 mg total) by mouth daily for 1 day. Course of  antibiotics if developing yeast infection vaginal itching discharge, may take 1 Diflucan 150 mg... There are 2 refills for any further symptoms every year   Humira (2 Pen) 40 MG/0.8ML Pnkt pen Generic drug: adalimumab Inject 40 mg into the skin once a week.   lactobacillus acidophilus & bulgar chewable tablet Chew 1 tablet by mouth 3 (three) times daily with meals for 15 days.               Discharge Care Instructions  (From admission, onward)           Start     Ordered   03/04/23 0000  Discharge wound care:       Comments: cleanse with NS, pat dry. Cover/fill with silver hydrofiber (Aquacel Ag+ Advantage, [employer] # [id number]), top with ABD pads and secure.Change daily. May moisten hydrofiber with NS prior to removal if adherent.   03/04/23 1230            Discharge Exam: Filed Weights   03/03/23 1802 03/03/23 2149  Weight: 65.8 kg 66 kg        General:  AAO x 3,  cooperative, no distress;   HEENT:  Normocephalic, PERRL, otherwise with in Normal limits   Neuro:  CNII-XII intact. , normal motor and sensation, reflexes intact   Lungs:   Clear to auscultation BL, Respirations unlabored,  No wheezes / crackles  Cardio:    S1/S2, RRR, No murmure, No Rubs or Gallops   Abdomen:  Soft, non-tender, bowel sounds active all four quadrants, no guarding or peritoneal signs.  Muscular  skeletal:  Limited exam -global generalized weaknesses - in bed, able to move all 4 extremities,   2+ pulses,  symmetric, No pitting edema  Skin:  Dry, warm to touch, tensive bilateral armpit wounds-with mild erythema, draining minimally-central chest wall wound mild erythema pustule no extensive erythema-bilateral groin wounds small pustules minimal drainage Extensive erythema or tenderness no edema noted in all the wounds            Condition at discharge: fair  The results of significant diagnostics from this hospitalization (including imaging, microbiology, ancillary and  laboratory) are listed below for reference.   Imaging Studies: No results found.  Microbiology: Results for orders placed or performed during the hospital encounter of 03/03/23  Blood culture (routine x 2)     Status: None (Preliminary result)   Collection Time: 03/03/23  9:41 PM   Specimen: BLOOD  Result Value Ref Range Status   Specimen Description BLOOD BLOOD LEFT ARM  Final   Special Requests   Final    BOTTLES DRAWN AEROBIC AND ANAEROBIC Blood Culture adequate volume   Culture   Final    NO GROWTH < 12 HOURS Performed at [facility], [address] [place], [place] [address]    Report Status PENDING  Incomplete  Blood culture (routine x 2)     Status: None (Preliminary result)   Collection Time: 03/03/23  9:43 PM   Specimen:  BLOOD  Result Value Ref Range Status   Specimen Description BLOOD BLOOD RIGHT HAND  Final   Special Requests   Final    BOTTLES DRAWN AEROBIC AND ANAEROBIC Blood Culture adequate volume   Culture   Final    NO GROWTH < 12 HOURS Performed at [facility], [address], [place], [place] [address]    Report Status PENDING  Incomplete    Labs: CBC: Recent Labs  Lab 03/03/23 1557 03/03/23 1824 03/04/23 0426  WBC 11.2* 12.5* 10.4  NEUTROABS 9.1* 10.0* 7.7  HGB 9.6* 10.0* 8.5*  HCT 30.8* 32.9* 28.9*  MCV 74* 76.2* 77.9*  PLT 616* 567* 507*   Basic Metabolic Panel: Recent Labs  Lab 03/03/23 1557 03/03/23 1824 03/04/23 0426  NA 137 134* 134*  K 4.4 3.4* 3.8  CL 100 101 103  CO2 23 24 25   GLUCOSE 94 105* 96  BUN 7 8 8   CREATININE 0.58 0.61 0.58  CALCIUM 9.1 8.9 8.5*  MG  --   --  2.2   Liver Function Tests: Recent Labs  Lab 03/03/23 1557 03/03/23 1824 03/04/23 0426  AST 13 18 13*  ALT 12 12 12   ALKPHOS 115 95 76  BILITOT <0.2 0.5 0.4  PROT 7.6 8.5* 7.2  ALBUMIN 3.7* 3.3* 2.7*   CBG: No results for input(s): "GLUCAP" in the last 168 hours.  Discharge time spent: greater than 30 minutes.  Signed: Kendell Bane,  MD [facility] 03/04/2023

## 2023-03-04 NOTE — Progress Notes (Signed)
Transition of Care [facility]) - Inpatient Brief Assessment   Patient Details  Name: Ashley Humphrey MRN: [id number] Date of Birth: [birth year]  Transition of Care [facility]) CM/SW Contact:    Leitha Bleak, RN Phone Number: 03/04/2023, 1:02 PM   Clinical Narrative:  Patient admitted in OBS, discharging home. No needs identified.   Transition of Care Asessment: Insurance and Status: (P) Insurance coverage has been reviewed Patient has primary care physician: (P) Yes Home environment has been reviewed: (P) home with family Prior level of function:: (P) independent Prior/Current Home Services: (P) No current home services Social Determinants of Health Reivew: (P) SDOH reviewed no interventions necessary Readmission risk has been reviewed: (P) Yes Transition of care needs: (P) no transition of care needs at this time

## 2023-03-04 NOTE — Consult Note (Addendum)
WOC Nurse Consult Note: Reason for Consult:Draining hidradenitis suppurativa lesions to chest, bilateral buttocks, axillae, inguinal areas. Lesions have been present for approximately 1.5 years Wound type:autoimmune, infectious Pressure Injury POA: Yes/No/NA Measurement:Not measured today Wound bed:red, moist Drainage (amount, consistency, odor) moderate yellow Periwound:with evidence of previous lesions, scarring Dressing procedure/placement/frequency: I have provided Nursing with guidance for the care of these lesions using a daily cleanse followed by application of a silver hydrofiber (antimicrobial, absorbent) dressing, Aquacel Ag+ Advantage [place] # [id number]) topped with ABD pads and secured. The dressing may need to be moistened with NS prior to changes if adherent. Securement can be with paper tape or with mesh briefs, T-Shirts, etc.  Recommend surgical consult for evaluation of lesions and consideration of interventions. If you agree, please order/arrange.  Other resources for patients in the [facility] geographic area are:  Dr. Bosie Clos [facility] Department of Dermatology Director of [facility] and Multidisciplinary Hidradenitis [facility]  [address] [place], [place] [address] Telephone: [phone] Fax: [phone]   [facility] Dermatology  Dr. Tammi Sou [facility] Information/Appointments [id number] [place], [place] [address] Telephone: [phone] Fax: [phone]   Department of Dermatology Dr. Brayton Caves  [facility], [facility] [address] [place], [place], [place] [address]  [facility] [address], [place] [address] To make, change or cancel an appointment please contact the dermatology appointment center at [phone].   [facility]atology [facility] [phone] - Marcy Panning [phone] Sandre Kitty [phone] Kathryne Sharper Telephone number: [phone]   WOC  nursing team will not follow, but will remain available to this patient, the nursing and medical teams.  Please re-consult if needed.  Thank you for inviting [place] to participate in this patient's Plan of Care.  Ladona Mow, MSN, RN, CNS, GNP, Leda Min, [employer], [employer] phone:  [phone]

## 2023-03-04 NOTE — Assessment & Plan Note (Signed)
-   Potassium 3.4 - Replace with 20 mEq of potassium - Recheck in the a.m.

## 2023-03-04 NOTE — Progress Notes (Signed)
  Echocardiogram 2D Echocardiogram has been performed.  Ashley Humphrey 03/04/2023, 10:30 AM

## 2023-03-04 NOTE — Assessment & Plan Note (Signed)
-   Cellulitis surrounding fluctuant abscesses - Patient started on vancomycin and cefepime in the ED - Continue vancomycin and cefepime - Blood cultures pending -Associated leukocytosis at 12.5, tachycardic initially in the 120s but now less than 110 - Patient reports that she normally is tachycardic - Continue IV fluids - Continue pain control

## 2023-03-04 NOTE — Assessment & Plan Note (Signed)
-   Involving bilateral axilla and bilateral groin - Had been on Humira prior to pregnancy - Restarted Humira 2 weeks ago - Derm at [facility] was consulted by phone and advised that they would not do anything other than antibiotics, so patient could safely be admitted here - Consult wound care - Continue to monitor

## 2023-03-04 NOTE — H&P (Signed)
History and Physical    Patient: Ashley Humphrey:[id number] DOB: Jan 15, 2001 DOA: 03/03/2023 DOS: the patient was seen and examined on 03/04/2023 PCP: Martina Sinner, NP  Patient coming from: Home  Chief Complaint:  Chief Complaint  Patient presents with   Abnormal Lab   HPI: Ashley Humphrey is a 22 y.o. female with medical history significant of hidradenitis suppurativa, anxiety and depression, and more presents the ED with a chief complaint of infected spots on her arms and legs.  Patient reports that these particular lesions started 1.5 years ago.  She reports she was on Humira for 7 months, she came off of it for her third trimester of her pregnancy, and restarted it 2 weeks ago.  Since has been restarted she has noticed no change.  She reports that the lesions are painful and describes the pain as burning.  She has not tried anything for the pain at home like Tylenol or ibuprofen.  She reports no fever.  Lesions are nondraining yellow fluid.  There is no order per her report.  Sometimes lesions or bleeding.  There is not any bleeding today.  She has not had any antibiotics outpatient for this problem.  She reports no 1 area is bothering her more than the other but the left axilla and the right groin seem to be worse than the contralateral sides.  Patient has no other complaints at this time.  Patient does not smoke, does not drink, does not use illicit drugs.  She did not get vaccinated for COVID or flu.  Patient is full code. Review of Systems: As mentioned in the history of present illness. All other systems reviewed and are negative. Past Medical History:  Diagnosis Date   Allergic rhinitis    Dysmenorrhea    Insomnia    Iron deficiency    Migraine    Mild intermittent asthma    Past Surgical History:  Procedure Laterality Date   NO PAST SURGERIES     Social History:  reports that she has never smoked. She has never used smokeless tobacco. She reports that she does  not drink alcohol and does not use drugs.  No Known Allergies  History reviewed. No pertinent family history.  Prior to Admission medications   Medication Sig Start Date End Date Taking? Authorizing Provider  adalimumab (HUMIRA, 2 PEN,) 40 MG/0.8ML PNKT pen Inject 40 mg into the skin once a week.    [provider]  escitalopram (LEXAPRO) 5 MG tablet Take 1 tablet (5 mg total) by mouth daily. 03/03/23   St Vena Austria, NP    Physical Exam: Vitals:   03/03/23 2126 03/03/23 2149 03/03/23 2152 03/03/23 2310  BP:   111/72 113/68  Pulse:   (!) 107 (!) 105  Resp: 17  18 18   Temp: 98.4 F (36.9 C)  98.6 F (37 C) 98.5 F (36.9 C)  TempSrc: Oral  Oral Oral  SpO2:   99% 98%  Weight:  66 kg    Height:       1.  General: Patient lying supine in bed,  no acute distress   2. Psychiatric: Alert and oriented x 3, mood and behavior normal for situation, pleasant and cooperative with exam   3. Neurologic: Speech and language are normal, face is symmetric, moves all 4 extremities voluntarily, at baseline without acute deficits on limited exam   4. HEENMT:  Head is atraumatic, normocephalic, pupils reactive to light, neck is supple, trachea is midline,  mucous membranes are moist   5. Respiratory : Lungs are clear to auscultation bilaterally without wheezing, rhonchi, rales, no cyanosis, no increase in work of breathing or accessory muscle use   6. Cardiovascular : Heart rate tachycardic, rhythm is regular, murmur present, rubs or gallops, no peripheral edema, peripheral pulses palpated   7. Gastrointestinal:  Abdomen is soft, nondistended, nontender to palpation bowel sounds active, no masses or organomegaly palpated   8. Skin:  Multiple fluctuant abscesses in bilateral axilla and bilateral groin, surrounded by erythema, with purulent drainage, no bleeding at this time   9.Musculoskeletal:  No acute deformities or trauma, no asymmetry in tone, no peripheral edema,  peripheral pulses palpated, no tenderness to palpation in the extremities  Data Reviewed: In the ED Temp 98.6, heart rate 123-131, respiratory 16, blood pressure 110/72-129/75, maintaining oxygen sats on room air Leukocytosis at 12.5, hemoglobin 10.0 with known history of iron deficiency anemia Thrombocytosis at 567 which is likely acute phase reactant Chemistry reveals a hypokalemia at 3.4 UA is not indicative of UTI Cefepime, bank, Benadryl started in the ED Derm at [facility] was consulted and just recommended antibiotics Admission requested for infected hidradenitis suppurativa  Assessment and Plan: * Cellulitis - Cellulitis surrounding fluctuant abscesses - Patient started on vancomycin and cefepime in the ED - Continue vancomycin and cefepime - Blood cultures pending -Associated leukocytosis at 12.5, tachycardic initially in the 120s but now less than 110 - Patient reports that she normally is tachycardic - Continue IV fluids - Continue pain control  Hypokalemia - Potassium 3.4 - Replace with 20 mEq of potassium - Recheck in the a.m.  Hidradenitis suppurativa of multiple sites - Involving bilateral axilla and bilateral groin - Had been on Humira prior to pregnancy - Restarted Humira 2 weeks ago - Derm at [facility] was consulted by phone and advised that they would not do anything other than antibiotics, so patient could safely be admitted here - Consult wound care - Continue to monitor      Advance Care Planning:   Code Status: Full Code  Consults: None at this time  Family Communication: No family at bedside  Severity of Illness: The appropriate patient status for this patient is OBSERVATION. Observation status is judged to be reasonable and necessary in order to provide the required intensity of service to ensure the patient's safety. The patient's presenting symptoms, physical exam findings, and initial radiographic and laboratory data in the context of  their medical condition is felt to place them at decreased risk for further clinical deterioration. Furthermore, it is anticipated that the patient will be medically stable for discharge from the hospital within 2 midnights of admission.   Author: Lilyan Gilford, DO 03/04/2023 3:17 AM  For on call review [email or web address].

## 2023-03-04 NOTE — Progress Notes (Signed)
She is currently admitted at the hospital, will discuss lab results at her next fu appointment.

## 2023-03-04 NOTE — Hospital Course (Signed)
Ashley Humphrey is a 22 y.o. female with medical history significant of hidradenitis suppurativa, anxiety and depression, and more presents the ED with a chief complaint of infected spots on her arms and legs.  Patient reports that these particular lesions started 1.5 years ago.  She reports she was on Humira for 7 months, she came off of it for her third trimester of her pregnancy, and restarted it 2 weeks ago.  Since has been restarted she has noticed no change.  She reports that the lesions are painful and describes the pain as burning.  She has not tried anything for the pain at home like Tylenol or ibuprofen.  She reports no fever.  Lesions are nondraining yellow fluid.  Sometimes lesions or bleeding.  There is not any bleeding today.  She has not had any antibiotics outpatient for this problem.  She reports no 1 area is bothering her more than the other but the left axilla and the right groin seem to be worse than the contralateral sides.  Patient has no other complaints at this time.   Patient does not smoke, does not drink, does not use illicit drugs.  She did not get vaccinated for COVID or flu.  Patient is full code.  ED Temp 98.6, heart rate 123-131, respiratory 16, blood pressure 110/72-129/75, maintaining oxygen sats on room air Leukocytosis at 12.5, hemoglobin 10.0 with known history of iron deficiency anemia Thrombocytosis at 567 which is likely acute phase reactant Chemistry reveals a hypokalemia at 3.4 UA is not indicative of UTI Cefepime, bank, Benadryl started in the ED Derm at [facility] was consulted and just recommended antibiotics Admission requested for infected hidradenitis suppurativa

## 2023-03-05 ENCOUNTER — Telehealth: Payer: Self-pay | Admitting: Nurse Practitioner

## 2023-03-05 ENCOUNTER — Other Ambulatory Visit: Payer: Self-pay | Admitting: Nurse Practitioner

## 2023-03-05 LAB — WOUND CULTURE

## 2023-03-05 LAB — CULTURE, BLOOD (ROUTINE X 2): Special Requests: ADEQUATE

## 2023-03-06 NOTE — Telephone Encounter (Signed)
Pt calling back about this message. She is aware Dois Davenport will address.

## 2023-03-06 NOTE — Telephone Encounter (Signed)
Pt aware and verbalized understanding.  

## 2023-03-07 LAB — WOUND CULTURE

## 2023-03-08 LAB — CULTURE, BLOOD (ROUTINE X 2): Culture: NO GROWTH

## 2023-03-11 ENCOUNTER — Ambulatory Visit: Payer: Medicaid Other | Admitting: Family Medicine

## 2023-03-26 ENCOUNTER — Other Ambulatory Visit: Payer: Self-pay | Admitting: Nurse Practitioner

## 2023-03-26 DIAGNOSIS — R110 Nausea: Secondary | ICD-10-CM

## 2023-03-26 MED ORDER — ONDANSETRON 4 MG PO TBDP
4.0000 mg | ORAL_TABLET | Freq: Three times a day (TID) | ORAL | 1 refills | Status: DC | PRN
Start: 2023-03-26 — End: 2023-06-16

## 2023-04-14 ENCOUNTER — Ambulatory Visit: Payer: Medicaid Other | Admitting: Nurse Practitioner

## 2023-04-14 ENCOUNTER — Encounter: Payer: Self-pay | Admitting: Nurse Practitioner

## 2023-04-14 VITALS — BP 103/69 | HR 125 | Temp 98.1°F | Ht 63.0 in | Wt 142.0 lb

## 2023-04-14 DIAGNOSIS — L732 Hidradenitis suppurativa: Secondary | ICD-10-CM | POA: Diagnosis not present

## 2023-04-14 MED ORDER — CLINDAMYCIN HCL 300 MG PO CAPS: 300.0000 mg | ORAL_CAPSULE | Freq: Three times a day (TID) | ORAL | 0 refills | Status: AC

## 2023-04-14 MED ORDER — GAUZE PADS & DRESSINGS 2"X4" PADS
1.0000 | MEDICATED_PAD | Freq: Two times a day (BID) | 10 refills | Status: AC
Start: 2023-04-14 — End: ?

## 2023-04-14 NOTE — Progress Notes (Signed)
Established Patient Office Visit  Subjective   Patient ID: Ashley Humphrey, female    DOB: [birth year]  Age: 22 y.o. MRN: [id number]  Chief Complaint  Patient presents with   Medical Management of Chronic Issues    6 week follow up    HPI Ashley Humphrey present today with her mother for 6-week follow-up for MDD and at [facility] Past medical history of Hx client was hospitalized and discharged with clindamycin 3 times a day however she only took a few of the pills and stopped taking them due to the aftertaste.  She has purulent stool on the left armpit.  Explained to client the importance of taking antibiotics to help with inspection she agrees to restart antibiotic and to continue Humira injection.  She is requesting prescription for gauze  MDD Client reports still dealing with increased depression as she has not started taking Lexapro 5 mg that was prescribed for her at her last appointment due to concern for weight gain.  Client BMI is 2515 and is inquiring about possible with last injection.  Explained to client that she is not a candidate for injectable for weight management and she should start taking Lexapro as prescribed      04/14/2023    3:02 PM 03/03/2023    3:53 PM 08/16/2019   11:24 AM  PHQ9 SCORE ONLY  PHQ-9 Total Score 7 7 0    Patient Active Problem List   Diagnosis Date Noted   Hypokalemia 03/04/2023   Routine medical exam 03/03/2023   Anxiety and depression 03/03/2023   Hidradenitis suppurativa of multiple sites 03/03/2023   Encounter to establish care 03/03/2023   Chest pain 03/03/2023   Cellulitis 03/03/2023   Vulval hidradenitis suppurativa 10/13/2022   Anemia during pregnancy in third trimester 08/16/2022   Elevated blood pressure affecting pregnancy in third trimester, antepartum 07/10/2020   Past Medical History:  Diagnosis Date   Allergic rhinitis    Dysmenorrhea    Insomnia    Iron deficiency    Migraine    Mild intermittent asthma    Past Surgical  History:  Procedure Laterality Date   NO PAST SURGERIES     Social History   Tobacco Use   Smoking status: Never   Smokeless tobacco: Never  Vaping Use   Vaping status: Never Used  Substance Use Topics   Alcohol use: Never   Drug use: Never   Social History   Socioeconomic History   Marital status: Single    Spouse name: Not on file   Number of children: Not on file   Years of education: Not on file   Highest education level: Not on file  Occupational History   Not on file  Tobacco Use   Smoking status: Never   Smokeless tobacco: Never  Vaping Use   Vaping status: Never Used  Substance and Sexual Activity   Alcohol use: Never   Drug use: Never   Sexual activity: Yes    Partners: Male  Other Topics Concern   Not on file  Social History Narrative   Not on file   Social Determinants of Health   Financial Resource Strain: Low Risk  (10/13/2022)   Received from [facility], [facility]   Overall Financial Resource Strain (CARDIA)    Difficulty of Paying Living Expenses: Not hard at all  Food Insecurity: No Food Insecurity (03/03/2023)   Hunger Vital Sign    Worried About Running Out of Food in the Last Year:  Never true    Ran Out of Food in the Last Year: Never true  Transportation Needs: No Transportation Needs (03/03/2023)   PRAPARE - Administrator, Civil Service (Medical): No    Lack of Transportation (Non-Medical): No  Physical Activity: Sufficiently Active (10/13/2022)   Received from [facility], [facility]   Exercise Vital Sign    Days of Exercise per Week: 6 days    Minutes of Exercise per Session: 30 min  Stress: No Stress Concern Present (10/13/2022)   Received from [facility], [facility] of Occupational Health - Occupational Stress Questionnaire    Feeling of Stress : Not at all  Social Connections: Socially Isolated (10/13/2022)   Received from [facility], [facility]   Social Connection  and Isolation Panel [NHANES]    Frequency of Communication with Friends and Family: More than three times a week    Frequency of Social Gatherings with Friends and Family: More than three times a week    Attends Religious Services: Never    Database administrator or Organizations: No    Attends Banker Meetings: Never    Marital Status: Never married  Intimate Partner Violence: Not At Risk (03/03/2023)   Humiliation, Afraid, Rape, and Kick questionnaire    Fear of Current or Ex-Partner: No    Emotionally Abused: No    Physically Abused: No    Sexually Abused: No   Family Status  Relation Name Status   Mother  Alive   Father  Alive  No partnership data on file   History reviewed. No pertinent family history.    Review of Systems  Constitutional:  Negative for chills and fever.  Respiratory:  Negative for cough and shortness of breath.   Cardiovascular:  Negative for chest pain and leg swelling.  Gastrointestinal:  Negative for blood in stool, melena, nausea and vomiting.  Skin:  Negative for rash.  Neurological:  Negative for dizziness, weakness and headaches.  Endo/Heme/Allergies:  Negative for polydipsia. Does not bruise/bleed easily.  Psychiatric/Behavioral:  Positive for depression. Negative for substance abuse. The patient does not have insomnia.    Negative unless indicated in HPI   Objective:     BP 103/69   Pulse (!) 125   Temp 98.1 F (36.7 C) (Temporal)   Ht 5\' 3"  (1.6 m)   Wt 142 lb (64.4 kg)   SpO2 97%   BMI 25.15 kg/m  BP Readings from Last 3 Encounters:  04/14/23 103/69  03/04/23 109/65  03/03/23 110/75   Wt Readings from Last 3 Encounters:  04/14/23 142 lb (64.4 kg)  03/03/23 145 lb 9.6 oz (66 kg)  03/03/23 143 lb 3.2 oz (65 kg)      Physical Exam Vitals and nursing note reviewed.  Constitutional:      Appearance: Normal appearance.  HENT:     Head: Normocephalic and atraumatic.  Cardiovascular:     Rate and Rhythm: Normal  rate and regular rhythm.  Pulmonary:     Effort: Pulmonary effort is normal.     Breath sounds: Normal breath sounds.  Musculoskeletal:        General: Normal range of motion.     Right lower leg: No edema.     Left lower leg: No edema.  Skin:    General: Skin is warm and dry.     Findings: Abscess present. No rash.     Comments: Left  axilla 10x8cm area with purulent drainage  Neurological:     General: No focal deficit present.     Mental Status: She is alert and oriented to person, place, and time. Mental status is at baseline.  Psychiatric:        Attention and Perception: Attention and perception normal.        Mood and Affect: Mood is depressed.        Speech: Speech normal.        Behavior: Behavior normal. Behavior is cooperative.        Thought Content: Thought content normal. Thought content does not include homicidal or suicidal ideation. Thought content does not include suicidal plan.        Cognition and Memory: Cognition and memory normal.        Judgment: Judgment normal.     No results found for any visits on 04/14/23.  Last CBC Lab Results  Component Value Date   WBC 10.4 03/04/2023   HGB 8.5 (L) 03/04/2023   HCT 28.9 (L) 03/04/2023   MCV 77.9 (L) 03/04/2023   MCH 22.9 (L) 03/04/2023   RDW 14.6 03/04/2023   PLT 507 (H) 03/04/2023   Last metabolic panel Lab Results  Component Value Date   GLUCOSE 96 03/04/2023   NA 134 (L) 03/04/2023   K 3.8 03/04/2023   CL 103 03/04/2023   CO2 25 03/04/2023   BUN 8 03/04/2023   CREATININE 0.58 03/04/2023   GFRNONAA >60 03/04/2023   CALCIUM 8.5 (L) 03/04/2023   PROT 7.2 03/04/2023   ALBUMIN 2.7 (L) 03/04/2023   LABGLOB 3.9 03/03/2023   BILITOT 0.4 03/04/2023   ALKPHOS 76 03/04/2023   AST 13 (L) 03/04/2023   ALT 12 03/04/2023   ANIONGAP 6 03/04/2023   Last lipids Lab Results  Component Value Date   CHOL 154 03/03/2023   HDL 28 (L) 03/03/2023   LDLCALC 111 (H) 03/03/2023   TRIG 77 03/03/2023   CHOLHDL  5.5 (H) 03/03/2023   Last hemoglobin A1c Lab Results  Component Value Date   HGBA1C 5.3 03/03/2023   Last thyroid functions Lab Results  Component Value Date   TSH 0.870 03/03/2023   T4TOTAL 7.9 03/03/2023        Assessment & Plan:  Hidradenitis suppurativa of multiple sites -     Ambulatory referral to Dermatology -     Gauze Pads & Dressings; 1 Bag by Does not apply route in the morning and at bedtime.  Dispense: 1 each; Refill: 10  Other orders -     Clindamycin HCl; Take 1 capsule (300 mg total) by mouth 3 (three) times daily.  Dispense: 42 capsule; Refill: 0   Ebony is a 22 year old African-American female seen today for HS and MDD no acute distress MDD plan to start taking Lexapro 5 mg daily as prescribed HS will start clindamycin mycin 300 mg 1 tab 3 times a day #42 dispense client to take all antibiotic until done Plan of care discussed with client, all questions answered Return in about 2 months (around 06/14/2023).    Arrie Aran Santa Lighter, DNP [facility] [address] [place], [place] [address] [phone]

## 2023-04-30 NOTE — Progress Notes (Deleted)
Established Patient Office Visit  Subjective   Patient ID: Ashley Humphrey, female    DOB: Feb 02, 2001  Age: 22 y.o. MRN: [id number]  No chief complaint on file.   HPI  {History (Optional):23778}  ROS Negative unless indicated in HPI   Objective:     There were no vitals taken for this visit. {Vitals History (Optional):23777}  Physical Exam   No results found for any visits on 05/01/23.  {Labs (Optional):23779}    Assessment & Plan:  There are no diagnoses linked to this encounter.    Continue healthy lifestyle choices, including diet (rich in fruits, vegetables, and lean proteins, and low in salt and simple carbohydrates) and exercise (at least 30 minutes of moderate physical activity daily).     The above assessment and management plan was discussed with the patient. The patient verbalized understanding of and has agreed to the management plan. Patient is aware to call the clinic if they develop any new symptoms or if symptoms persist or worsen. Patient is aware when to return to the clinic for a follow-up visit. Patient educated on when it is appropriate to go to the emergency department.  No follow-ups on file.    Arrie Aran Santa Lighter, DNP [facility] [address] [place], [place] [address] [phone]

## 2023-05-01 ENCOUNTER — Ambulatory Visit: Payer: Medicaid Other | Admitting: Nurse Practitioner

## 2023-05-04 DIAGNOSIS — R112 Nausea with vomiting, unspecified: Secondary | ICD-10-CM | POA: Diagnosis not present

## 2023-05-04 DIAGNOSIS — R0602 Shortness of breath: Secondary | ICD-10-CM | POA: Diagnosis not present

## 2023-05-04 DIAGNOSIS — R1012 Left upper quadrant pain: Secondary | ICD-10-CM | POA: Diagnosis not present

## 2023-05-04 DIAGNOSIS — E860 Dehydration: Secondary | ICD-10-CM | POA: Diagnosis not present

## 2023-05-04 DIAGNOSIS — Z760 Encounter for issue of repeat prescription: Secondary | ICD-10-CM | POA: Diagnosis not present

## 2023-05-04 DIAGNOSIS — R509 Fever, unspecified: Secondary | ICD-10-CM | POA: Diagnosis not present

## 2023-05-04 DIAGNOSIS — R109 Unspecified abdominal pain: Secondary | ICD-10-CM | POA: Diagnosis not present

## 2023-05-04 DIAGNOSIS — R42 Dizziness and giddiness: Secondary | ICD-10-CM | POA: Diagnosis not present

## 2023-05-04 DIAGNOSIS — I959 Hypotension, unspecified: Secondary | ICD-10-CM | POA: Diagnosis not present

## 2023-05-05 ENCOUNTER — Ambulatory Visit (INDEPENDENT_AMBULATORY_CARE_PROVIDER_SITE_OTHER): Payer: Medicaid Other

## 2023-05-05 DIAGNOSIS — Z111 Encounter for screening for respiratory tuberculosis: Secondary | ICD-10-CM

## 2023-05-05 NOTE — Progress Notes (Signed)
TB skin test placed to left forearm.  Patient tolerated well. Appointment scheduled for patient to return to have read on 05/08/23.

## 2023-05-08 ENCOUNTER — Ambulatory Visit: Payer: Medicaid Other

## 2023-05-08 DIAGNOSIS — Z111 Encounter for screening for respiratory tuberculosis: Secondary | ICD-10-CM

## 2023-05-08 LAB — TB SKIN TEST
Induration: 0 mm
TB Skin Test: NEGATIVE

## 2023-05-08 NOTE — Progress Notes (Signed)
Patient here today to have TB skin test read.  Results are negative.  Letter given to patient with results.

## 2023-06-16 ENCOUNTER — Telehealth (INDEPENDENT_AMBULATORY_CARE_PROVIDER_SITE_OTHER): Payer: Medicaid Other | Admitting: Nurse Practitioner

## 2023-06-16 ENCOUNTER — Encounter: Payer: Self-pay | Admitting: Nurse Practitioner

## 2023-06-16 DIAGNOSIS — F32A Depression, unspecified: Secondary | ICD-10-CM | POA: Diagnosis not present

## 2023-06-16 DIAGNOSIS — L732 Hidradenitis suppurativa: Secondary | ICD-10-CM

## 2023-06-16 DIAGNOSIS — F419 Anxiety disorder, unspecified: Secondary | ICD-10-CM

## 2023-06-16 NOTE — Progress Notes (Signed)
Virtual Visit via video Note Due to COVID-19 pandemic this visit was conducted virtually. This visit type was conducted due to national recommendations for restrictions regarding the COVID-19 Pandemic (e.g. social distancing, sheltering in place) in an effort to limit this patient's exposure and mitigate transmission in our community. All issues noted in this document were discussed and addressed.  A physical exam was not performed with this format.   I connected with Ashley Humphrey on 06/16/2023 at 1300 by [place] and verified that I am speaking with the correct person using two identifiers. Ashley Humphrey is currently located at Home and mother is currently with them during visit. The provider, Martina Sinner, NP is located in their office at time of visit.  I discussed the limitations, risks, security and privacy concerns of performing an evaluation and management service by virtual visit and the availability of in person appointments. I also discussed with the patient that there may be a patient responsible charge related to this service. The patient expressed understanding and agreed to proceed.  Subjective:  Patient ID: Ashley Humphrey, female    DOB: June 07, 2001, 22 y.o.   MRN: [id number]  Chief Complaint:  No chief complaint on file.   HPI: Ashley Humphrey is a 22 y.o. female presenting on 06/16/2023 via video visit as a f/u for anxiety HS  HS: currently being managed with cleocin TID, however client is not in compliance wit treatment. " When I started the ATB they almost closed, so I stopped it and they worst now. Educate client on the importance of medications adherence for her condition.  Anxiety depression: She is prescribed Lexapro daily, reports that she only been taking it as needed. "When my daughter is not with me I am fine, so I only take when she is here". Explains to her that Lexapro is not a PRN med.  Relevant past medical, surgical, family, and social history  reviewed and updated as indicated.  Allergies and medications reviewed and updated.   Past Medical History:  Diagnosis Date   Allergic rhinitis    Dysmenorrhea    Insomnia    Iron deficiency    Migraine    Mild intermittent asthma     Past Surgical History:  Procedure Laterality Date   NO PAST SURGERIES      Social History   Socioeconomic History   Marital status: Single    Spouse name: Not on file   Number of children: Not on file   Years of education: Not on file   Highest education level: Not on file  Occupational History   Not on file  Tobacco Use   Smoking status: Never   Smokeless tobacco: Never  Vaping Use   Vaping status: Never Used  Substance and Sexual Activity   Alcohol use: Never   Drug use: Never   Sexual activity: Yes    Partners: Male  Other Topics Concern   Not on file  Social History Narrative   Not on file   Social Determinants of Health   Financial Resource Strain: Low Risk  (10/13/2022)   Received from [facility], [facility]   Overall Financial Resource Strain (CARDIA)    Difficulty of Paying Living Expenses: Not hard at all  Food Insecurity: No Food Insecurity (03/03/2023)   Hunger Vital Sign    Worried About Running Out of Food in the Last Year: Never true    Ran Out of Food in the Last Year: Never  true  Transportation Needs: No Transportation Needs (03/03/2023)   PRAPARE - Administrator, Civil Service (Medical): No    Lack of Transportation (Non-Medical): No  Physical Activity: Sufficiently Active (10/13/2022)   Received from [facility], [facility]   Exercise Vital Sign    Days of Exercise per Week: 6 days    Minutes of Exercise per Session: 30 min  Stress: No Stress Concern Present (10/13/2022)   Received from [facility], [facility] of Occupational Health - Occupational Stress Questionnaire    Feeling of Stress : Not at all  Social Connections: Socially Isolated (10/13/2022)    Received from [facility], [facility]   Social Connection and Isolation Panel [NHANES]    Frequency of Communication with Friends and Family: More than three times a week    Frequency of Social Gatherings with Friends and Family: More than three times a week    Attends Religious Services: Never    Database administrator or Organizations: No    Attends Banker Meetings: Never    Marital Status: Never married  Intimate Partner Violence: Not At Risk (03/03/2023)   Humiliation, Afraid, Rape, and Kick questionnaire    Fear of Current or Ex-Partner: No    Emotionally Abused: No    Physically Abused: No    Sexually Abused: No    Outpatient Encounter Medications as of 06/16/2023  Medication Sig   adalimumab (HUMIRA, 2 PEN,) 40 MG/0.8ML PNKT pen Inject 40 mg into the skin once a week.   clindamycin (CLEOCIN) 300 MG capsule Take 1 capsule (300 mg total) by mouth 3 (three) times daily.   escitalopram (LEXAPRO) 5 MG tablet Take 1 tablet (5 mg total) by mouth daily. (Patient not taking: Reported on 04/14/2023)   Gauze Pads & Dressings 2"X4" PADS 1 Bag by Does not apply route in the morning and at bedtime.   [DISCONTINUED] ondansetron (ZOFRAN-ODT) 4 MG disintegrating tablet Take 1 tablet (4 mg total) by mouth every 8 (eight) hours as needed for nausea or vomiting. (Patient not taking: Reported on 04/14/2023)   No facility-administered encounter medications on file as of 06/16/2023.    No Known Allergies  Review of Systems  Constitutional:  Negative for chills and fatigue.  HENT:  Negative for congestion.   Cardiovascular:  Negative for chest pain and palpitations.  Gastrointestinal:  Negative for constipation, nausea and vomiting.  Genitourinary:  Negative for flank pain.  Musculoskeletal:  Negative for arthralgias.  Allergic/Immunologic: Negative for environmental allergies, food allergies and immunocompromised state.  Neurological:  Negative for dizziness and numbness.   Psychiatric/Behavioral:  Negative for confusion and self-injury.     Observations/Objective: No vital signs or physical exam, this was a virtual health encounter.  Pt alert and oriented, answers all questions appropriately, and able to speak in full sentences.    Assessment and Plan: Diagnoses and all orders for this visit:  Anxiety and depression  Hidradenitis suppurativa of multiple sites   Zahraa is a 22 yrs old African American, no acute distress Continue taking Lexapro as prescribed for MDD/GAD _ HS: finish the course of ATB that was prescribed  No medications refilled needed   Follow Up Instructions: Return in about 3 months (around 09/16/2023) for follow-up.    I discussed the assessment and treatment plan with the patient. The patient was provided an opportunity to ask questions and all were answered. The patient agreed with the plan  and demonstrated an understanding of the instructions.   The patient was advised to call back or seek an in-person evaluation if the symptoms worsen or if the condition fails to improve as anticipated.  The above assessment and management plan was discussed with the patient. The patient verbalized understanding of and has agreed to the management plan. Patient is aware to call the clinic if they develop any new symptoms or if symptoms persist or worsen. Patient is aware when to return to the clinic for a follow-up visit. Patient educated on when it is appropriate to go to the emergency department.    I provided 10 minutes of time during this video encounter.   Arrie Aran Santa Lighter, DNP [facility] [address] [place], [place] [address] [phone] 06/16/2023

## 2023-07-02 ENCOUNTER — Ambulatory Visit: Payer: Medicaid Other | Admitting: Nurse Practitioner

## 2023-07-17 ENCOUNTER — Telehealth: Payer: Self-pay | Admitting: Nurse Practitioner

## 2023-10-20 ENCOUNTER — Ambulatory Visit: Payer: Medicaid Other | Admitting: Nurse Practitioner

## 2023-10-21 ENCOUNTER — Ambulatory Visit: Payer: Medicaid Other | Admitting: Nurse Practitioner

## 2023-10-21 ENCOUNTER — Encounter: Payer: Self-pay | Admitting: Nurse Practitioner

## 2023-10-21 NOTE — Progress Notes (Deleted)
   Established Patient Office Visit  Subjective  Patient ID: Ashley Humphrey, female    DOB: 03-30-2001  Age: 23 y.o. MRN: [id number]  No chief complaint on file.   HPI  {History (Optional):23778}  ROS Negative unless indicated in HPI   Objective:     There were no vitals taken for this visit. {Vitals History (Optional):23777}  Physical Exam   No results found for any visits on 10/21/23.  {Labs (Optional):23779}    Assessment & Plan:  There are no diagnoses linked to this encounter.  No follow-ups on file.    @Thayer Embleton  Janee Morn, [place]    Note: This document was prepared by [employer] voice dictation technology and any errors that results from this process are unintentional.

## 2023-10-23 NOTE — Progress Notes (Signed)
Established Patient Office Visit  Subjective  Patient ID: Ashley Humphrey, female    DOB: 07-Feb-2001  Age: 23 y.o. MRN: [id number]  Chief Complaint  Patient presents with   Abdominal Pain    Stomach feels like it is getting tight and is painful for few months    HPI Ashley Humphrey 23 year old female present 09/26/2023 with her mother for an acute visit concern for abdominal pain.  Abdominal pain new problems: She is a very poor historian and is noncompliant with treatment plan.  Client is a very poor historian reports that she has been experiencing stomach pain for few months now open "it comes sometimes when I eat certain food and goes away when I do not eat the food but I do not remember what kind of food" she was seen at the emergency department in August 2024 for abdominal pain and was prescribed omeprazole 40 mg daily which she reports she never took because she cannot swallow pills.   Weight loss: 23 year old female presents with a chief complaint of significant weight loss, reporting a loss of 30 lbs over the past 2 months. Her weight was 142 lbs on 04/14/2023, and today her weight is 129 lbs. She attributes the weight loss to eating only one meal or sometimes nothing daily. The patient also reports fatigue but denies any other specific symptoms such as nausea, vomiting, or gastrointestinal issues. Given her poor dietary intake and significant weight loss, hypothyroidism or another endocrine disorder is a concern. TSH (Thyroid Stimulating Hormone) test will be ordered to assess for thyroid dysfunction. Further workup will depend on the results of the TSH and any other clinical findings. Denies hx of anorexia  Anxiety, Follow-up  She was last seen for anxiety 4 months ago. Changes made at last visit include Lexapro 5 mg.  She reports poor compliance with treatment. She reports poor tolerance of treatment. She is not having side effects.  She stopped taking Lexapro because she cannot  swallow pill She feels her anxiety is moderate and Unchanged since last visit. Symptoms: No chest pain No difficulty concentrating  No dizziness No fatigue  No feelings of losing control No insomnia  No irritable No palpitations  No panic attacks No racing thoughts  No shortness of breath No sweating  No tremors/shakes    GAD-7 Results    10/27/2023    7:05 PM 04/14/2023    3:03 PM 03/03/2023    4:00 PM  GAD-7 Generalized Anxiety Disorder Screening Tool  1. Feeling Nervous, Anxious, or on Edge 1 1 1   2. Not Being Able to Stop or Control Worrying 1 1 1   3. Worrying Too Much About Different Things 2 1 1   4. Trouble Relaxing 0 0 0  5. Being So Restless it's Hard To Sit Still 0 0 0  6. Becoming Easily Annoyed or Irritable 2 2 2   7. Feeling Afraid As If Something Awful Might Happen 1 0 1  Total GAD-7 Score 7 5 6   Difficulty At Work, Home, or Getting  Along With Others? Somewhat difficult Somewhat difficult Somewhat difficult    PHQ-9 Scores    10/27/2023    7:06 PM 04/14/2023    3:02 PM 03/03/2023    3:53 PM  PHQ9 SCORE ONLY  PHQ-9 Total Score 7 7 7    Tachycardia New problem:  23 year old female with a known history of iron deficiency anemia presents with complaints of recent weight loss and feeling pale. She reports tachycardia and a significant decrease  in her food intake, consuming "only one meal or sometimes nothing" daily. She denies any recent illness or changes in bowel habits. The patient appears pale on examination, and her vital signs are notable for an elevated heart rate. Given her history of anemia and poor nutritional intake, she is at risk for worsening iron deficiency, with potential contributions from malnutrition or eating behaviors. Labs including iron studies and an ECG are planned to assess the severity of her anemia and rule out any potential underlying arrhythmias or electrolyte disturbances.  HS 23 year old female with a history of hidradenitis suppurativa (HS)  presents with multiple abscesses on her body, accompanied by copious amounts of drainage. She reports being prescribed clindamycin 300 mg TID, but has not been taking the medication as directed because she is unable to swallow pills. The patient states, "I can't swallow pills." She was advised to take the clindamycin with applesauce or pudding to help with swallowing the medication more easily. Additionally, further education on the importance of medication adherence.  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   Anemia during pregnancy in third trimester 08/16/2022   Elevated blood pressure affecting pregnancy in third trimester, antepartum 07/10/2020   Past Medical History:  Diagnosis Date   Allergic rhinitis    Dysmenorrhea    Insomnia    Iron deficiency    Migraine    Mild intermittent asthma    Past Surgical History:  Procedure Laterality Date   NO PAST SURGERIES     Social History   Tobacco Use   Smoking status: Never   Smokeless tobacco: Never  Vaping Use   Vaping status: Never Used  Substance Use Topics   Alcohol use: Never   Drug use: Never   Social History   Socioeconomic History   Marital status: Single    Spouse name: Not on file   Number of children: Not on file   Years of education: Not on file   Highest education level: 12th grade  Occupational History   Not on file  Tobacco Use   Smoking status: Never   Smokeless tobacco: Never  Vaping Use   Vaping status: Never Used  Substance and Sexual Activity   Alcohol use: Never   Drug use: Never    Sexual activity: Yes    Partners: Male  Other Topics Concern   Not on file  Social History Narrative   Not on file   Social Drivers of Health   Financial Resource Strain: Low Risk  (10/27/2023)   Overall Financial Resource Strain (CARDIA)    Difficulty of Paying Living Expenses: Not hard at all  Food Insecurity: No Food Insecurity (10/27/2023)   Hunger Vital Sign    Worried About Running Out of Food in the Last Year: Never true    Ran Out of Food in the Last Year: Never true  Transportation Needs: No Transportation Needs (10/27/2023)   PRAPARE - Administrator, Civil Service (Medical): No    Lack of Transportation (Non-Medical): No  Physical Activity: Unknown (10/27/2023)   Exercise Vital Sign    Days of Exercise per Week:  0 days    Minutes of Exercise per Session: Not on file  Stress: Stress Concern Present (10/27/2023)   [employer] of Occupational Health - Occupational Stress Questionnaire    Feeling of Stress : To some extent  Social Connections: Unknown (10/27/2023)   Social Connection and Isolation Panel [NHANES]    Frequency of Communication with Friends and Family: More than three times a week    Frequency of Social Gatherings with Friends and Family: More than three times a week    Attends Religious Services: Never    Database administrator or Organizations: No    Attends Banker Meetings: Not on file    Marital Status: Patient declined  Intimate Partner Violence: Not At Risk (03/03/2023)   Humiliation, Afraid, Rape, and Kick questionnaire    Fear of Current or Ex-Partner: No    Emotionally Abused: No    Physically Abused: No    Sexually Abused: No   Family Status  Relation Name Status   Mother  Alive   Father  Alive  No partnership data on file   History reviewed. No pertinent family history. No Known Allergies    Review of Systems  Constitutional:  Negative for chills, fever and malaise/fatigue.  HENT:  Negative for congestion  and sore throat.   Respiratory:  Negative for cough and shortness of breath.   Cardiovascular:  Negative for chest pain and leg swelling.  Gastrointestinal:  Positive for abdominal pain and heartburn. Negative for constipation, diarrhea, nausea and vomiting.  Genitourinary:  Negative for frequency and urgency.  Musculoskeletal:  Negative for falls.  Skin:  Negative for itching and rash.       Abscess amrpit   Neurological:  Negative for dizziness and headaches.  Psychiatric/Behavioral:  Positive for depression. The patient is nervous/anxious.    Negative unless indicated in HPI   Objective:     BP 115/68   Pulse (!) 122   Temp 97.7 F (36.5 C) (Temporal)   Ht 5\' 3"  (1.6 m)   Wt 129 lb (58.5 kg)   SpO2 99%   BMI 22.85 kg/m  BP Readings from Last 3 Encounters:  10/27/23 115/68  04/14/23 103/69  03/04/23 109/65   Wt Readings from Last 3 Encounters:  10/27/23 129 lb (58.5 kg)  04/14/23 142 lb (64.4 kg)  03/03/23 145 lb 9.6 oz (66 kg)      Physical Exam Vitals and nursing note reviewed.  Constitutional:      General: She is not in acute distress. HENT:     Head: Normocephalic and atraumatic.     Nose: Nose normal.     Mouth/Throat:     Mouth: Mucous membranes are moist.  Eyes:     General: No scleral icterus.    Extraocular Movements: Extraocular movements intact.     Conjunctiva/sclera: Conjunctivae normal.     Pupils: Pupils are equal, round, and reactive to light.  Cardiovascular:     Rate and Rhythm: Tachycardia present.  Pulmonary:     Effort: Pulmonary effort is normal. No respiratory distress.     Breath sounds: Normal breath sounds.  Neurological:     Mental Status: She is alert and oriented to person, place, and time.  Psychiatric:        Attention and Perception: Attention normal.        Mood and Affect: Mood is anxious and depressed.        Speech: Speech normal.  Behavior: Behavior normal. Behavior is cooperative.        Thought Content:  Thought content normal. Thought content does not include homicidal or suicidal ideation. Thought content does not include homicidal or suicidal plan.        Cognition and Memory: Cognition and memory normal.        Judgment: Judgment normal.      No results found for any visits on 10/27/23.  Last CBC Lab Results  Component Value Date   WBC 10.4 03/04/2023   HGB 8.5 (L) 03/04/2023   HCT 28.9 (L) 03/04/2023   MCV 77.9 (L) 03/04/2023   MCH 22.9 (L) 03/04/2023   RDW 14.6 03/04/2023   PLT 507 (H) 03/04/2023   Last metabolic panel Lab Results  Component Value Date   GLUCOSE 96 03/04/2023   NA 134 (L) 03/04/2023   K 3.8 03/04/2023   CL 103 03/04/2023   CO2 25 03/04/2023   BUN 8 03/04/2023   CREATININE 0.58 03/04/2023   GFRNONAA >60 03/04/2023   CALCIUM 8.5 (L) 03/04/2023   PROT 7.2 03/04/2023   ALBUMIN 2.7 (L) 03/04/2023   LABGLOB 3.9 03/03/2023   BILITOT 0.4 03/04/2023   ALKPHOS 76 03/04/2023   AST 13 (L) 03/04/2023   ALT 12 03/04/2023   ANIONGAP 6 03/04/2023   Last lipids Lab Results  Component Value Date   CHOL 154 03/03/2023   HDL 28 (L) 03/03/2023   LDLCALC 111 (H) 03/03/2023   TRIG 77 03/03/2023   CHOLHDL 5.5 (H) 03/03/2023   Last hemoglobin A1c Lab Results  Component Value Date   HGBA1C 5.3 03/03/2023   Last thyroid functions Lab Results  Component Value Date   TSH 0.870 03/03/2023   T4TOTAL 7.9 03/03/2023        Assessment & Plan:  Increased heart rate -     EKG 12-Lead -     Anemia Profile B  Encounter for general adult medical examination with abnormal findings -     Anemia Profile B -     CMP14+EGFR -     Thyroid Panel With TSH -     Lipid panel  Astraea is a 23 year old African-American female seen today for chronic disease management, no acute distress  Tachycardia possible due to anemia:  EKG and iron study  GERD: Continue Prilosec 40 mg daily, okay to open capsule and put it on food a  HS: Advised clients CMP used applesauce,  pudding to take antibiotic  Weight loss: Consider adding boost or Ensure twice daily supplemental with diet  Labs: Anemia profile, CMP, lipid, TSH result pending  Encourage healthy lifestyle choices, including diet (rich in fruits, vegetables, and lean proteins, and low in salt and simple carbohydrates) and exercise (at least 30 minutes of moderate physical activity daily).     The above assessment and management plan was discussed with the patient. The patient verbalized understanding of and has agreed to the management plan. Patient is aware to call the clinic if they develop any new symptoms or if symptoms persist or worsen. Patient is aware when to return to the clinic for a follow-up visit. Patient educated on when it is appropriate to go to the emergency department.  She  Return in about 2 months (around 12/25/2023).    Note: This document was prepared by [employer] voice dictation technology and any errors that results from this process are unintentional.

## 2023-10-27 ENCOUNTER — Ambulatory Visit (INDEPENDENT_AMBULATORY_CARE_PROVIDER_SITE_OTHER): Payer: Medicaid Other | Admitting: Nurse Practitioner

## 2023-10-27 VITALS — BP 115/68 | HR 122 | Temp 97.7°F | Ht 63.0 in | Wt 129.0 lb

## 2023-10-27 DIAGNOSIS — R1084 Generalized abdominal pain: Secondary | ICD-10-CM

## 2023-10-27 DIAGNOSIS — R000 Tachycardia, unspecified: Secondary | ICD-10-CM | POA: Insufficient documentation

## 2023-10-27 DIAGNOSIS — Z0001 Encounter for general adult medical examination with abnormal findings: Secondary | ICD-10-CM | POA: Insufficient documentation

## 2023-10-28 ENCOUNTER — Other Ambulatory Visit: Payer: Self-pay | Admitting: Nurse Practitioner

## 2023-10-28 DIAGNOSIS — D508 Other iron deficiency anemias: Secondary | ICD-10-CM

## 2023-10-28 LAB — ANEMIA PROFILE B
Basophils Absolute: 0.1 10*3/uL (ref 0.0–0.2)
Basos: 1 %
EOS (ABSOLUTE): 0.1 10*3/uL (ref 0.0–0.4)
Eos: 1 %
Ferritin: 262 ng/mL — ABNORMAL HIGH (ref 15–150)
Folate: 2.4 ng/mL — ABNORMAL LOW (ref >3.0)
Hematocrit: 32.5 % — ABNORMAL LOW (ref 34.0–46.6)
Hemoglobin: 9.4 g/dL — ABNORMAL LOW (ref 11.1–15.9)
Immature Grans (Abs): 0 10*3/uL (ref 0.0–0.1)
Immature Granulocytes: 0 %
Iron Saturation: 7 % — CL (ref 15–55)
Iron: 12 ug/dL — ABNORMAL LOW (ref 27–159)
Lymphocytes Absolute: 1.3 10*3/uL (ref 0.7–3.1)
Lymphs: 13 %
MCH: 21.4 pg — ABNORMAL LOW (ref 26.6–33.0)
MCHC: 28.9 g/dL — ABNORMAL LOW (ref 31.5–35.7)
MCV: 74 fL — ABNORMAL LOW (ref 79–97)
Monocytes Absolute: 0.5 10*3/uL (ref 0.1–0.9)
Monocytes: 5 %
Neutrophils Absolute: 8.2 10*3/uL — ABNORMAL HIGH (ref 1.4–7.0)
Neutrophils: 80 %
Platelets: 639 10*3/uL — ABNORMAL HIGH (ref 150–450)
RBC: 4.4 x10E6/uL (ref 3.77–5.28)
RDW: 15.8 % — ABNORMAL HIGH (ref 11.7–15.4)
Retic Ct Pct: 1.3 % (ref 0.6–2.6)
Total Iron Binding Capacity: 175 ug/dL — ABNORMAL LOW (ref 250–450)
UIBC: 163 ug/dL (ref 131–425)
Vitamin B-12: 868 pg/mL (ref 232–1245)
WBC: 10.2 10*3/uL (ref 3.4–10.8)

## 2023-10-28 LAB — LIPID PANEL
Chol/HDL Ratio: 4.2 {ratio} (ref 0.0–4.4)
Cholesterol, Total: 146 mg/dL (ref 100–199)
HDL: 35 mg/dL — ABNORMAL LOW (ref >39)
LDL Chol Calc (NIH): 100 mg/dL — ABNORMAL HIGH (ref 0–99)
Triglycerides: 50 mg/dL (ref 0–149)
VLDL Cholesterol Cal: 11 mg/dL (ref 5–40)

## 2023-10-28 LAB — THYROID PANEL WITH TSH
Free Thyroxine Index: 3 (ref 1.2–4.9)
T3 Uptake Ratio: 35 % (ref 24–39)
T4, Total: 8.5 ug/dL (ref 4.5–12.0)
TSH: 1.39 u[IU]/mL (ref 0.450–4.500)

## 2023-10-28 LAB — CMP14+EGFR
ALT: 10 [IU]/L (ref 0–32)
AST: 16 [IU]/L (ref 0–40)
Albumin: 3.5 g/dL — ABNORMAL LOW (ref 4.0–5.0)
Alkaline Phosphatase: 111 [IU]/L (ref 44–121)
BUN/Creatinine Ratio: 14 (ref 9–23)
BUN: 7 mg/dL (ref 6–20)
Bilirubin Total: 0.2 mg/dL (ref 0.0–1.2)
CO2: 22 mmol/L (ref 20–29)
Calcium: 9.2 mg/dL (ref 8.7–10.2)
Chloride: 99 mmol/L (ref 96–106)
Creatinine, Ser: 0.5 mg/dL — ABNORMAL LOW (ref 0.57–1.00)
Globulin, Total: 4.4 g/dL (ref 1.5–4.5)
Glucose: 87 mg/dL (ref 70–99)
Potassium: 4.1 mmol/L (ref 3.5–5.2)
Sodium: 135 mmol/L (ref 134–144)
Total Protein: 7.9 g/dL (ref 6.0–8.5)
eGFR: 135 mL/min/{1.73_m2} (ref >59)

## 2023-10-28 MED ORDER — FERROUS SULFATE 300 (60 FE) MG/5ML PO SOLN: 300.0000 mg | Freq: Every day | ORAL | 3 refills | Status: AC

## 2023-10-28 NOTE — Progress Notes (Signed)
Ferrous sulfate sent to her pharmacy

## 2023-10-29 DIAGNOSIS — H5213 Myopia, bilateral: Secondary | ICD-10-CM | POA: Diagnosis not present

## 2023-12-20 NOTE — Progress Notes (Deleted)
   Established Patient Office Visit  Subjective  Patient ID: Ashley Humphrey, female    DOB: 2000/10/07  Age: 23 y.o. MRN: [id number]  No chief complaint on file.   HPI  {History (Optional):23778}  ROS Negative unless indicated in HPI   Objective:     There were no vitals taken for this visit. {Vitals History (Optional):23777}  Physical Exam   No results found for any visits on 12/25/23.  {Labs (Optional):23779}    Assessment & Plan:  There are no diagnoses linked to this encounter.  No follow-ups on file.    @Ashley Humphrey  Ashley Humphrey, [place]    Note: This document was prepared by [employer] voice dictation technology and any errors that results from this process are unintentional.

## 2023-12-25 ENCOUNTER — Ambulatory Visit: Payer: Medicaid Other | Admitting: Nurse Practitioner

## 2024-02-16 DIAGNOSIS — Z5181 Encounter for therapeutic drug level monitoring: Secondary | ICD-10-CM | POA: Diagnosis not present

## 2024-02-16 DIAGNOSIS — L732 Hidradenitis suppurativa: Secondary | ICD-10-CM | POA: Diagnosis not present

## 2024-02-24 ENCOUNTER — Ambulatory Visit: Admitting: Nurse Practitioner

## 2024-02-25 ENCOUNTER — Encounter: Payer: Self-pay | Admitting: Nurse Practitioner

## 2024-03-10 ENCOUNTER — Ambulatory Visit: Admitting: Nurse Practitioner

## 2024-03-17 NOTE — Progress Notes (Deleted)
 Established Patient Office Visit  Subjective  Patient ID: Ashley Humphrey, female    DOB: 22-Dec-2000  Age: 23 y.o. MRN: [id number]  No chief complaint on file.   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   Anemia during pregnancy in third trimester 08/16/2022   Elevated blood pressure affecting pregnancy in third trimester, antepartum 07/10/2020   Past Medical History:  Diagnosis Date   Allergic rhinitis    Dysmenorrhea    Insomnia    Iron deficiency    Migraine    Mild intermittent asthma    Past Surgical History:  Procedure Laterality Date   NO PAST SURGERIES     Social History   Tobacco Use   Smoking status: Never   Smokeless tobacco: Never  Vaping Use   Vaping status: Never Used  Substance Use Topics   Alcohol use: Never   Drug use: Never   Social History   Socioeconomic History   Marital status: Single    Spouse name: Not on file   Number of children: Not on file   Years of education: Not on file   Highest education level: 12th grade  Occupational History   Not on file  Tobacco Use   Smoking status: Never   Smokeless tobacco: Never  Vaping Use   Vaping status: Never Used  Substance and Sexual Activity   Alcohol use: Never   Drug use: Never   Sexual activity: Yes    Partners: Male  Other Topics Concern   Not on file  Social History Narrative   Not on file   Social Drivers of Health   Financial Resource Strain: Low Risk  (10/27/2023)   Overall Financial Resource Strain (CARDIA)    Difficulty of Paying Living Expenses: Not hard  at all  Food Insecurity: No Food Insecurity (10/27/2023)   Hunger Vital Sign    Worried About Running Out of Food in the Last Year: Never true    Ran Out of Food in the Last Year: Never true  Transportation Needs: No Transportation Needs (10/27/2023)   PRAPARE - Administrator, Civil Service (Medical): No    Lack of Transportation (Non-Medical): No  Physical Activity: Unknown (10/27/2023)   Exercise Vital Sign    Days of Exercise per Week: 0 days    Minutes of Exercise per Session: Not on file  Stress: Stress Concern Present (10/27/2023)   [employer] of Occupational Health - Occupational Stress Questionnaire    Feeling of Stress : To some extent  Social Connections: Unknown (10/27/2023)   Social Connection and Isolation Panel    Frequency of Communication with Friends and Family: More than three times a week    Frequency of Social Gatherings with Friends and Family: More than three times a week    Attends Religious Services: Never    Database administrator or Organizations: No    Attends Banker Meetings: Not on file    Marital Status: Patient declined  Intimate Partner Violence: Not At Risk (03/03/2023)   Humiliation, Afraid, Rape, and Kick questionnaire    Fear  of Current or Ex-Partner: No    Emotionally Abused: No    Physically Abused: No    Sexually Abused: No   Family Status  Relation Name Status   Mother  Alive   Father  Alive  No partnership data on file   No family history on file. No Known Allergies    ROS Negative unless indicated in HPI   Objective:     There were no vitals taken for this visit. BP Readings from Last 3 Encounters:  10/27/23 115/68  04/14/23 103/69  03/04/23 109/65   Wt Readings from Last 3 Encounters:  10/27/23 129 lb (58.5 kg)  04/14/23 142 lb (64.4 kg)  03/03/23 145 lb 9.6 oz (66 kg)      Physical Exam   No results found for any visits on 03/18/24.  Last CBC Lab Results  Component Value Date    WBC 10.2 10/27/2023   HGB 9.4 (L) 10/27/2023   HCT 32.5 (L) 10/27/2023   MCV 74 (L) 10/27/2023   MCH 21.4 (L) 10/27/2023   RDW 15.8 (H) 10/27/2023   PLT 639 (H) 10/27/2023   Last metabolic panel Lab Results  Component Value Date   GLUCOSE 87 10/27/2023   NA 135 10/27/2023   K 4.1 10/27/2023   CL 99 10/27/2023   CO2 22 10/27/2023   BUN 7 10/27/2023   CREATININE 0.50 (L) 10/27/2023   EGFR 135 10/27/2023   CALCIUM 9.2 10/27/2023   PROT 7.9 10/27/2023   ALBUMIN 3.5 (L) 10/27/2023   LABGLOB 4.4 10/27/2023   BILITOT 0.2 10/27/2023   ALKPHOS 111 10/27/2023   AST 16 10/27/2023   ALT 10 10/27/2023   ANIONGAP 6 03/04/2023   Last lipids Lab Results  Component Value Date   CHOL 146 10/27/2023   HDL 35 (L) 10/27/2023   LDLCALC 100 (H) 10/27/2023   TRIG 50 10/27/2023   CHOLHDL 4.2 10/27/2023   Last hemoglobin A1c Lab Results  Component Value Date   HGBA1C 5.3 03/03/2023   Last thyroid  functions Lab Results  Component Value Date   TSH 1.390 10/27/2023   T4TOTAL 8.5 10/27/2023        Assessment & Plan:  There are no diagnoses linked to this encounter. Continue healthy lifestyle choices, including diet (rich in fruits, vegetables, and lean proteins, and low in salt and simple carbohydrates) and exercise (at least 30 minutes of moderate physical activity daily).     The above assessment and management plan was discussed with the patient. The patient verbalized understanding of and has agreed to the management plan. Patient is aware to call the clinic if they develop any new symptoms or if symptoms persist or worsen. Patient is aware when to return to the clinic for a follow-up visit. Patient educated on when it is appropriate to go to the emergency department.  No follow-ups on file.    Finneus Kaneshiro [place] Thompson, DNP [facility] [address] [place], [place] [address] [phone]    Note: This document was prepared by [employer] voice  dictation technology and any errors that results from this process are unintentional.

## 2024-03-18 ENCOUNTER — Ambulatory Visit: Admitting: Nurse Practitioner

## 2024-03-18 ENCOUNTER — Encounter: Payer: Self-pay | Admitting: Nurse Practitioner

## 2024-03-26 ENCOUNTER — Encounter: Payer: Self-pay | Admitting: Advanced Practice Midwife

## 2024-05-04 ENCOUNTER — Ambulatory Visit: Payer: Medicaid Other | Admitting: Dermatology

## 2024-06-28 DIAGNOSIS — L732 Hidradenitis suppurativa: Secondary | ICD-10-CM | POA: Diagnosis not present

## 2024-06-28 DIAGNOSIS — M545 Low back pain, unspecified: Secondary | ICD-10-CM | POA: Diagnosis not present

## 2024-07-01 DIAGNOSIS — Z5181 Encounter for therapeutic drug level monitoring: Secondary | ICD-10-CM | POA: Diagnosis not present

## 2024-07-01 DIAGNOSIS — L732 Hidradenitis suppurativa: Secondary | ICD-10-CM | POA: Diagnosis not present
